# Patient Record
Sex: Male | Born: 1970 | Race: White | Hispanic: No | Marital: Married | Smoking: Current every day smoker
Health system: Southern US, Community
[De-identification: ages and names within clinical notes are randomized; demographics above are authoritative.]

## PROBLEM LIST (undated history)

## (undated) DIAGNOSIS — N200 Calculus of kidney: Secondary | ICD-10-CM

## (undated) HISTORY — PX: HERNIA REPAIR: SHX51

## (undated) HISTORY — DX: Calculus of kidney: N20.0

---

## 2005-11-11 ENCOUNTER — Emergency Department (HOSPITAL_COMMUNITY): Admission: EM | Admit: 2005-11-11 | Discharge: 2005-11-11 | Payer: Self-pay | Admitting: Emergency Medicine

## 2007-11-16 ENCOUNTER — Emergency Department (HOSPITAL_COMMUNITY): Admission: EM | Admit: 2007-11-16 | Discharge: 2007-11-16 | Payer: Self-pay | Admitting: Emergency Medicine

## 2011-01-06 LAB — POCT I-STAT, CHEM 8
Calcium, Ion: 1.16
Chloride: 109
Glucose, Bld: 96
HCT: 41
TCO2: 25

## 2014-07-16 ENCOUNTER — Inpatient Hospital Stay
Admit: 2014-07-16 | Disposition: A | Discharge status: Home / Self Care | Payer: Self-pay | Attending: Internal Medicine | Admitting: Internal Medicine

## 2014-07-16 HISTORY — PX: CYSTOSCOPY W/ URETERAL STENT PLACEMENT: SHX1429

## 2014-07-16 LAB — COMPREHENSIVE METABOLIC PANEL
ALT: 22 U/L
Albumin: 4.6 g/dL
Alkaline Phosphatase: 42 U/L
Anion Gap: 7 (ref 7–16)
BUN: 22 mg/dL — ABNORMAL HIGH
Bilirubin,Total: 0.3 mg/dL
CALCIUM: 9.2 mg/dL
CO2: 26 mmol/L
CREATININE: 1.28 mg/dL — AB
Chloride: 103 mmol/L
EGFR (African American): >60
EGFR (Non-African Amer.): >60
Glucose: 104 mg/dL — ABNORMAL HIGH
POTASSIUM: 3.9 mmol/L
SGOT(AST): 23 U/L
Sodium: 136 mmol/L
Total Protein: 7.3 g/dL

## 2014-07-16 LAB — URINALYSIS, COMPLETE
BILIRUBIN, UR: NEGATIVE
GLUCOSE, UR: NEGATIVE mg/dL (ref 0–75)
Ketone: NEGATIVE
Leukocyte Esterase: NEGATIVE
NITRITE: NEGATIVE
PH: 5 (ref 4.5–8.0)
Protein: NEGATIVE
Specific Gravity: 1.015 (ref 1.003–1.030)

## 2014-07-16 LAB — CBC
HCT: 40.4 % (ref 40.0–52.0)
HGB: 13.7 g/dL (ref 13.0–18.0)
MCH: 31.4 pg (ref 26.0–34.0)
MCHC: 34 g/dL (ref 32.0–36.0)
MCV: 92 fL (ref 80–100)
PLATELETS: 199 10*3/uL (ref 150–440)
RBC: 4.37 10*6/uL — ABNORMAL LOW (ref 4.40–5.90)
RDW: 13.1 % (ref 11.5–14.5)
WBC: 10.6 10*3/uL (ref 3.8–10.6)

## 2014-07-17 LAB — BASIC METABOLIC PANEL
Anion Gap: 2 — ABNORMAL LOW (ref 7–16)
BUN: 16 mg/dL
Calcium, Total: 8.1 mg/dL — ABNORMAL LOW
Chloride: 107 mmol/L
Co2: 29 mmol/L
Creatinine: 1 mg/dL
EGFR (African American): >60
EGFR (Non-African Amer.): >60
GLUCOSE: 95 mg/dL
Potassium: 4 mmol/L
SODIUM: 138 mmol/L

## 2014-07-17 LAB — CBC WITH DIFFERENTIAL/PLATELET
Basophil #: 0 10*3/uL (ref 0.0–0.1)
Basophil %: 0.2 %
EOS ABS: 0.1 10*3/uL (ref 0.0–0.7)
Eosinophil %: 0.9 %
HCT: 37.6 % — ABNORMAL LOW (ref 40.0–52.0)
HGB: 12.5 g/dL — ABNORMAL LOW (ref 13.0–18.0)
LYMPHS ABS: 1.6 10*3/uL (ref 1.0–3.6)
Lymphocyte %: 20.9 %
MCH: 31.1 pg (ref 26.0–34.0)
MCHC: 33.1 g/dL (ref 32.0–36.0)
MCV: 94 fL (ref 80–100)
MONO ABS: 1 x10 3/mm (ref 0.2–1.0)
Monocyte %: 12.3 %
NEUTROS ABS: 5.1 10*3/uL (ref 1.4–6.5)
Neutrophil %: 65.7 %
PLATELETS: 163 10*3/uL (ref 150–440)
RBC: 4.01 10*6/uL — ABNORMAL LOW (ref 4.40–5.90)
RDW: 13.2 % (ref 11.5–14.5)
WBC: 7.8 10*3/uL (ref 3.8–10.6)

## 2014-07-18 LAB — URINE CULTURE

## 2014-08-04 ENCOUNTER — Ambulatory Visit: Payer: Self-pay

## 2014-08-04 NOTE — Anesthesia Preprocedure Evaluation (Addendum)
Anesthesia Evaluation  Patient identified by MRN, date of birth, ID band Patient awake    Reviewed: Allergy & Precautions, NPO status , Patient's Chart, lab work & pertinent test results  Airway Mallampati: I  TM Distance: >3 FB Neck ROM: Full    Dental  (+) Teeth Intact   Pulmonary Current Smoker,  breath sounds clear to auscultation        Cardiovascular Exercise Tolerance: Good Rhythm:Regular Rate:Normal     Neuro/Psych    GI/Hepatic Neg liver ROS, GERD-  Medicated,NPO 9:30 last night, no reflux today, took prilosec.   Endo/Other    Renal/GU Renal InsufficiencyRenal diseaseSlightly elevated creatinine since stone.   Kidney stone right, and stent.    Musculoskeletal   Abdominal   Peds  Hematology   Anesthesia Other Findings   Reproductive/Obstetrics                          Anesthesia Physical Anesthesia Plan  ASA: II  Anesthesia Plan: General   Post-op Pain Management:    Induction: Intravenous  Airway Management Planned: Oral ETT and LMA  Additional Equipment:   Intra-op Plan:   Post-operative Plan: Extubation in OR  Informed Consent: I have reviewed the patients History and Physical, chart, labs and discussed the procedure including the risks, benefits and alternatives for the proposed anesthesia with the patient or authorized representative who has indicated his/her understanding and acceptance.     Plan Discussed with: CRNA  Anesthesia Plan Comments:        Anesthesia Quick Evaluation

## 2014-08-06 ENCOUNTER — Ambulatory Visit
Admit: 2014-08-06 | Disposition: A | Discharge status: Home / Self Care | Payer: Self-pay | Attending: Urology | Admitting: Urology

## 2014-08-06 LAB — URINALYSIS, COMPLETE
Bilirubin,UR: NEGATIVE
GLUCOSE, UR: NEGATIVE mg/dL (ref 0–75)
KETONE: NEGATIVE
Nitrite: NEGATIVE
Ph: 7 (ref 4.5–8.0)
Protein: 100
Specific Gravity: 1.014 (ref 1.003–1.030)
Squamous Epithelial: NONE SEEN

## 2014-08-08 LAB — URINE CULTURE

## 2014-08-09 NOTE — Consult Note (Signed)
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 No prior stones. No additional stones. NO fevers or leukocytosis. Pain refractory to IV narcotics.  PMH sig for left inguinal hernia repair, GERD. No CV disease. No blood thinners.  Today "Catalina Antigua" is seen as urgent consult for baove. He is referred by Bettey Costa MD. Last meal >12 hours ago.    No Known Allergies:   Case History and Physical Exam:  Chief Complaint flank pain   Past Surgical History Inguinal Hernia Repair   Family History Non-Contributory   HEENT PERLA   Neck/Nodes Supple   Chest/Lungs Clear   Cardiovascular Normal Sinus Rhythm   Abdomen Benign   Genitalia WNL  Mild Rt CVAT   Musculoskeletal Full range of motion   Neurological Grossly WNL   Skin Warm   Nursing/Ancillary Notes: **Vital Signs.:   07-Apr-16 14:45  Vital Signs Type Admission  Temperature Temperature (F) 98.7  Celsius 37  Temperature Source oral  Pulse Pulse 76  Systolic BP Systolic BP 161  Diastolic BP (mmHg) Diastolic BP (mmHg) 89   Routine Chem:  07-Apr-16 09:35   Creatinine (comp)  1.28 (0.61-1.24 NOTE: New Reference Range  06/16/14)  Routine UA:  07-Apr-16 09:35   Color (UA) YELLOW  Clarity (UA) CLEAR  Glucose (UA) NEGATIVE  Bilirubin (UA) NEGATIVE  Ketones (UA) NEGATIVE  Specific Gravity (UA) 1.015  Blood (UA) 3+  pH (UA) 5.0  Protein (UA) NEGATIVE  Nitrite (UA) NEGATIVE  Leukocyte Esterase (UA) NEGATIVE (Result(s) reported on 16 Jul 2014 at 10:43AM.)  RBC (UA) >30  WBC (UA) 0-5  Bacteria (UA) 1+ (TRACE/FEW)  Epithelial Cells (UA) 0-5 / HPF  Mucous (UA) PRESENT (Result(s) reported on 16 Jul 2014 at 10:43AM.)    Impression 1 - Right Distal Ureteral Stone, Refractory Pain   Plan 1 -  Right Distal Ureteral Stone, Refractory Pain - Discussed options of medical therapy v. urgent renal decompression with right ureteral stent followed by SWL or ureteroscopy in elective setting. Immediate goals pain control and renal decompression. Risks, benefits, alternatives discussed in detail. Pt voiced understanding and wants to proceed today.   Electronic Signatures: Georgie Chard (MD)  (Signed 07-Apr-16 17:01)  Authored: Health Issues, General Aspect/Present Illness, Allergies, History and Physical Exam, Vital Signs, Labs, Impression/Plan   Last Updated: 07-Apr-16 17:01 by Georgie Chard (MD)

## 2014-08-09 NOTE — Consult Note (Signed)
Chief Complaint:  Subjective/Chief Complaint pain improved this AM, residual stent pain with voiding.  no n/v.   VITAL SIGNS/ANCILLARY NOTES: **Vital Signs.:   08-Apr-16 07:55  Vital Signs Type Q 8hr  Temperature Temperature (F) 98.1  Celsius 36.7  Temperature Source oral  Pulse Pulse 74  Respirations Respirations 18  Systolic BP Systolic BP 98  Diastolic BP (mmHg) Diastolic BP (mmHg) 58  Mean BP 71  Pulse Ox % Pulse Ox % 95  Pulse Ox Activity Level  At rest  Oxygen Delivery Room Air/ 21 %  *Intake and Output.:   Daily 08-Apr-16 07:00  Grand Totals Intake:  1444 Output:  1325    Net:  119 24 Hr.:  119  Oral Intake      In:  360  IV (Primary)      In:  1084  Urine ml     Out:  1325  Length of Stay Totals Intake:  1444 Output:  1325    Net:  119   Brief Assessment:  GEN well developed, well nourished, no acute distress   Cardiac Regular   Respiratory normal resp effort  no use of accessory muscles   Gastrointestinal Normal   Gastrointestinal details normal Soft  Nontender  Nondistended  No masses palpable   EXTR negative cyanosis/clubbing   Lab Results: Routine Chem:  08-Apr-16 06:06   Glucose, Serum 95 (65-99 NOTE: New Reference Range  06/16/14)  BUN 16 (6-20 NOTE: New Reference Range  06/16/14)  Creatinine (comp) 1.00 (0.61-1.24 NOTE: New Reference Range  06/16/14)  Sodium, Serum 138 (135-145 NOTE: New Reference Range  06/16/14)  Potassium, Serum 4.0 (3.5-5.1 NOTE: New Reference Range  06/16/14)  Chloride, Serum 107 (101-111 NOTE: New Reference Range  06/16/14)  CO2, Serum 29 (22-32 NOTE: New Reference Range  06/16/14)  Calcium (Total), Serum  8.1 (8.9-10.3 NOTE: New Reference Range  06/16/14)  Anion Gap  2  eGFR (African American) >60  eGFR (Non-African American) >60 (eGFR values <47m/min/1.73 m2 may be an indication of chronic kidney disease (CKD). Calculated eGFR is useful in patients with stable renal function. The eGFR calculation  will not be reliable in acutely ill patients when serum creatinine is changing rapidly. It is not useful in patients on dialysis. The eGFR calculation may not be applicable to patients at the low and high extremes of body sizes, pregnant women, and vegetarians.)  Routine Hem:  08-Apr-16 06:06   WBC (CBC) 7.8  RBC (CBC)  4.01  Hemoglobin (CBC)  12.5  Hematocrit (CBC)  37.6  Platelet Count (CBC) 163  MCV 94  MCH 31.1  MCHC 33.1  RDW 13.2  Neutrophil % 65.7  Lymphocyte % 20.9  Monocyte % 12.3  Eosinophil % 0.9  Basophil % 0.2  Neutrophil # 5.1  Lymphocyte # 1.6  Monocyte # 1.0  Eosinophil # 0.1  Basophil # 0.0 (Result(s) reported on 17 Jul 2014 at 06:43AM.)   Assessment/Plan:  Assessment/Plan:  Assessment 44yo M POD 1 s/p right ureteral stent placement for 612mRt UVJ stone.  Doing well.  Cr/ pain improved.   Plan 1) OK to discharge today 2) Please discharge with pain meds, ditropan 5 mg tid prn bladder spasms and Flomax 0.4 mg qhs 3) Patient should STRAIN urine in case he passes stone spontaneneously 4) f/u BuAustinext week to arrange for definitive management of his stone (prefers URS)  Please page with any questions or concerns.   Electronic Signatures: BrSherlynn StallsMD)  (Signed  08-Apr-16 08:00)  Authored: Chief Complaint, VITAL SIGNS/ANCILLARY NOTES, Brief Assessment, Lab Results, Assessment/Plan   Last Updated: 08-Apr-16 08:00 by Sherlynn Stalls (MD)

## 2014-08-09 NOTE — Op Note (Signed)
PATIENT NAME:  Harold Sosa, Harold Sosa MR#:  [id number] DATE OF BIRTH:  1971/01/13  DATE OF PROCEDURE:  07/16/2014  PREOPERATIVE DIAGNOSES:   1.  Right ureteral stone. 2.  Hydronephrosis. 3.  Refractory flank pain.    PROCEDURE:   1.  Cystoscopy right retrograde pyelogram (Dictation Anomaly)<<MISSING TEXT>> lesion. 2.  Right ureteral stent placement, 6 x 26 (Dictation Anomaly)<<MISSING TEXT>>.  ESTIMATED BLOOD LOSS:  Nil.  (Dictation Anomaly)<<MISSING TEXT>> None.  SPECIMENS:  None.  FINDINGS: 1.  Unremarkable urinary bladder. 2.  Moderate hydroureter nephrosis to the level distal ureter.  3.  Successful placement of right ureteral stent, proximal and renal pelvis, distal and urinary bladder.   INDICATIONS:  [name] is a very pleasant 44 year old gentleman without significant medical history. He presented to the [facility] Emergency Room early today with acute onset of right flank pain.  CT imaging revealed a distal right ureteral stone.  He had no obvious infectious parameters.  He was admitted as his pain control was somewhat difficult.  His pain remained refractory despite both rounds of IV narcotics.  Consultation was sought.  We evaluated the patient and discussed options for renal decompression including a nephrostomy tube versus stenting versus non-decompression and he wished to proceed with right ureteral stenting.  Informed consent was obtained and placed in the medical records.     The patient is being (Dictation Anomaly)<<MISSING TEXT>> right ureteral stent placement confirmed.  A (Dictation Anomaly)<<MISSING TEXT>> was performed.  Antibiotics were administered, general LMA anesthesia was introduced.  The patient was placed into a low lithotomy position and a sterile field was created by prepping and draping the patient's penis.  Peritoneum (Dictation Anomaly)<<MISSING TEXT>> using iodine x 3.  Next, cystourethroscopy was performed using a 26 [place] (Dictation Anomaly)<<MISSING TEXT>>   with offset lens, especially anterior and posterior urethra under (Dictation Anomaly)<<MISSING TEXT>> revealed no diverticula, calcifications, or (Dictation Anomaly)<<MISSING TEXT>>  lesions.  The right (Dictation Anomaly)<<MISSING TEXT>>  orifice was (Dictation Anomaly)<<MISSING TEXT>> catheter and right retrograde pyelogram was seen.   Right retrograde pyelogram (Dictation Anomaly)<<MISSING TEXT>> single a right ureter, single (Dictation Anomaly)<<MISSING TEXT>> kidney.  There was moderate hydroureter nephrosis at the level of the distal ureter at 0.038.  A central wire was advanced along the (Dictation Anomaly)<<MISSING TEXT>>  pole of which a new 6 x 26 (Dictation Anomaly)<<MISSING TEXT>> type stent was carefully placed to the proximal end.  Good proximal deployment was noted in the upper pole, distal and urinary bladder.  There was efflux of copious (Dictation Anomaly)<<MISSING TEXT>> appearing urine around and through the distal end of the stent.  Bladder (Dictation Anomaly)<<MISSING TEXT>> terminated.  The patient tolerated the procedure well (Dictation Anomaly)<<MISSING TEXT>>.   The patient was taken to the postanesthesia care unit in stable condition.           ____________________________ Lillia Pauls Tresa Moore, MD trm:DT D: 07/16/2014 18:12:03 ET T: 07/16/2014 20:09:20 ET JOB#: [id number]  cc: Lillia Pauls. Tresa Moore, MD, <Dictator>

## 2014-08-09 NOTE — Discharge Summary (Signed)
PATIENT NAME:  Harold Sosa, Harold Sosa MR#:  [id number] DATE OF BIRTH:  Oct 08, 1970  DATE OF ADMISSION:  07/16/2014 DATE OF DISCHARGE:  07/17/2014  DISCHARGE DIAGNOSES:  Right urethral stone status post stent.   DISCHARGE MEDICATIONS: 1.  Nexium over-the-counter 20 mg daily.  2.  Percocet 5/325 one tablet every 6 hours as needed for pain.  3.  Flomax 0.4 mg oral once a day.  4.  Oxybutynin 5 mg oral 3 times a day as needed for bladder spasms.   DISCHARGE INSTRUCTIONS: Regular diet of regular consistency. Activity as tolerated. Follow up with Dr. Erlene Quan of urology in 1 to 2 weeks. The patient was also counseled to quit smoking during the hospital stay.   CONSULTS:  Sherlynn Stalls, MD, with urology.   PROCEDURES: Cystoscopy and ureteral stent placement on the right side.   IMAGING STUDIES: Include CT scan of the abdomen and pelvis without contrast which showed mild right-sided hydronephrosis, 6 mm calcified obstructive calculus at the right UVJ urinary bladder.   ADMITTING HISTORY AND PHYSICAL AND HOSPITAL COURSE: Please see detailed H and P dictated by Dr. Benjie Karvonen. In brief, a 44 year old male patient presented to the hospital complaining of right-sided flank, groin pain. He had a CT of the abdomen and pelvis done which shows a 6 mm obstructive stone with mild right-sided hydronephrosis, was admitted to the hospitalist service. Urology was consulted, was taken to the OR; had a right ureteral stent placement, with which she has improved well but stone is still present, but he will follow up with Dr. Erlene Quan in his office for further care in 1 to 2 weeks. The patient did not have any infection. Pain was well controlled by day of discharge and is being discharged in a stable condition.   Prior to discharge, the patient's abdomen had some mild tenderness in the left flank area. S1, S2 heard. Lungs sound clear.   Time spent on day of discharge in discharge activity was 40 minutes.     ____________________________ [name], MD srs:nt D: 07/20/2014 13:23:47 ET T: 07/20/2014 18:13:12 ET JOB#: [id number]  cc: Alveta Heimlich R. Darvin Neighbours, MD, <Dictator> Sherlynn Stalls, MD Neita Carp MD ELECTRONICALLY SIGNED 08/03/2014 10:49

## 2014-08-09 NOTE — H&P (Signed)
PATIENT NAME:  Harold Sosa, Harold Sosa MR#:  [id number] DATE OF BIRTH:  1970/08/13  DATE OF ADMISSION:  07/16/2014  PRIMARY CARE PHYSICIAN: Meindert A. Brunetta Genera, MD   CHIEF COMPLAINT: Flank pain.   HISTORY OF PRESENT ILLNESS: This is a 44 year old male with no past medical history, who presents with the above complaint. Since midnight, the patient has had intense 10/10 right-sided flank pain and noted hematuria this morning. CT scan in the Emergency Department showed mild right hydronephrosis and hydroureter, along with a 6 mm calcified obstructive calculus in the right UVJ/urinary bladder wall. The patient continues to have pain, despite receiving Dilaudid.   PAST MEDICAL HISTORY: BPH.  MEDICATIONS: Flomax 0.4 mg daily and Nexium OTC 20 mg daily p.r.n.   ALLERGIES: No known drug allergies.   SOCIAL HISTORY: The patient smokes 1/2 pack a day. Occasional alcohol. No IV drug use.   PAST SURGICAL HISTORY: Hernia surgery.   FAMILY HISTORY: Positive for asthma and prostate cancer in remission.   REVIEW OF SYSTEMS:  CONSTITUTIONAL: No fever. Positive chills. Positive weakness.  EYES: Has no blurred or double vision, glaucoma, cataracts.  EARS, NOSE, AND THROAT: No ear pain or hearing loss, seasonal allergies.  RESPIRATORY: No cough, wheezing, hemoptysis, COPD. CARDIOVASCULAR: No chest pain, orthopnea, edema, arrhythmia, or dyspnea on exertion. blood cells and glucose emesis of deep vein thrombosis in the evening and was seen on days.  GASTROINTESTINAL: Positive nausea. No vomiting, diarrhea. Positive right-sided flank pain. No melena or ulcers.  GENITOURINARY: Positive hematuria.  ENDOCRINE: No polyphagia polydipsia HEMATOLOGICAL AND LYMPHATIC: No anemia or easy bruising.  SKIN: No rashes or lesions. MUSCULOSKELETAL: No pain in shoulders or knees.  NEUROLOGICAL: No history of CVA, TIA, or seizures.  PSYCHIATRIC: No history of anxiety or depression.   PHYSICAL EXAMINATION: VITAL SIGNS: The  patient is afebrile, temperature 98.9, pulse 79, respirations 18, blood pressure 127/86, at  96% on room air.  GENERAL: The patient is alert, oriented, in moderate distress due to pain.  HEENT: Head is atraumatic. Pupils are round and reactive. Sclerae anicteric. Mucous membranes are moist. Oropharynx is clear.  NECK: Supple without JVD, carotid bruit, or enlarged thyroid.  CARDIOVASCULAR: Regular rate and rhythm. No murmurs or gallops. PMI is nondisplaced.. LUNGS: Clear to auscultation without crackles, rales, rhonchi or wheezing noted to percussion.  ABDOMEN: Bowel sounds are positive. Nontender, nondistended. No hepatosplenomegaly.  BACK: He has right-sided CVA tenderness.  EXTREMITIES: No clubbing, cyanosis, or edema.  NEUROLOGICAL: Cranial nerves II through XII are intact. There are no focal deficits.  SKIN: Without rash or lesions.    LABORATORY DATA: CT of the abdomen and pelvis shows a mild right hydronephrosis and hydroureter with a 6 mm calcified obstructive calculus in the right UPJ/urinary bladder wall. There is an indeterminant low density lesion in the right hepatic lobe, measures at least 4 cm, further evaluation with nonemergent MRI.   Urinalysis shows greater than 30 red blood cells 1+ bacteria, 0 to 5 white blood cells, negative LCE and nitrites. White blood cells 10.6, hemoglobin 13.7, hematocrit 41, platelets are 199,000. Sodium 136, potassium 3.9, chloride 103, bicarbonate 26, BUN 22, creatinine 1.28, glucose is 104, bilirubin 0.3, calcium 9.2, alkaline phosphatase 42, ALT 22, AST 23, total protein 7.3, albumin 4.6.   ASSESSMENT AND PLAN: This is a 44 year old male who presents with right-sided flank pain, found to have a 6 mm obstructive kidney stone with mild hydronephrosis.  1. Right nephrolithiasis with obstructive uropathy. The patient will be admitted to the hospitalist  service with pain control. Urology has been consulted. Continue antiemetics p.r.n. We will also add  ciprofloxacin. A urine culture has been ordered.  2. Hematuria due to the kidney stone.  3. Tobacco dependence. The patient is encouraged to stop smoking. The patient was counseled for 3-1/2 minutes. He does not want a nicotine patch.  4. Benign prostatic hypertrophy. We will continue Flomax.   CODE STATUS: The patient is a FULL CODE status.    TIME SPENT: Approximately 45 minutes.     ____________________________ Donell Beers. Benjie Karvonen, MD spm:mw D: 07/16/2014 14:16:52 ET T: 07/16/2014 14:53:34 ET JOB#: [id number]  cc: Alannie Amodio P. Benjie Karvonen, MD, <Dictator> [facility] Cordale Manera P Cobey Raineri MD ELECTRONICALLY SIGNED 07/16/2014 17:27

## 2014-08-12 ENCOUNTER — Ambulatory Visit: Payer: 59 | Admitting: Anesthesiology

## 2014-08-12 ENCOUNTER — Encounter
Admission: RE | Disposition: A | Discharge status: Home / Self Care | Payer: Self-pay | Source: Ambulatory Visit | Attending: Urology

## 2014-08-12 ENCOUNTER — Ambulatory Visit
Admission: RE | Admit: 2014-08-12 | Discharge: 2014-08-12 | Disposition: A | Discharge status: Home / Self Care | Payer: 59 | Source: Ambulatory Visit | Attending: Urology | Admitting: Urology

## 2014-08-12 ENCOUNTER — Ambulatory Visit: Payer: 59 | Admitting: *Deleted

## 2014-08-12 DIAGNOSIS — Z9889 Other specified postprocedural states: Secondary | ICD-10-CM | POA: Diagnosis not present

## 2014-08-12 DIAGNOSIS — N200 Calculus of kidney: Secondary | ICD-10-CM

## 2014-08-12 DIAGNOSIS — N201 Calculus of ureter: Secondary | ICD-10-CM | POA: Insufficient documentation

## 2014-08-12 DIAGNOSIS — F17200 Nicotine dependence, unspecified, uncomplicated: Secondary | ICD-10-CM | POA: Diagnosis not present

## 2014-08-12 DIAGNOSIS — K219 Gastro-esophageal reflux disease without esophagitis: Secondary | ICD-10-CM | POA: Diagnosis not present

## 2014-08-12 DIAGNOSIS — Z79899 Other long term (current) drug therapy: Secondary | ICD-10-CM | POA: Insufficient documentation

## 2014-08-12 DIAGNOSIS — Z79891 Long term (current) use of opiate analgesic: Secondary | ICD-10-CM | POA: Diagnosis not present

## 2014-08-12 HISTORY — PX: URETEROSCOPY WITH HOLMIUM LASER LITHOTRIPSY: SHX6645

## 2014-08-12 HISTORY — PX: CYSTOSCOPY W/ URETERAL STENT PLACEMENT: SHX1429

## 2014-08-12 SURGERY — URETEROSCOPY, WITH LITHOTRIPSY USING HOLMIUM LASER
Anesthesia: General | Laterality: Right | Wound class: Clean Contaminated

## 2014-08-12 MED ORDER — ACETAMINOPHEN 10 MG/ML IV SOLN
INTRAVENOUS | Status: AC
  Administered 2014-08-12: 1000 mg via INTRAVENOUS
  Filled 2014-08-12: qty 100

## 2014-08-12 MED ORDER — LEVOFLOXACIN IN D5W 500 MG/100ML IV SOLN
500.0000 mg | Freq: Once | INTRAVENOUS | Status: AC
  Administered 2014-08-12: 500 mg via INTRAVENOUS

## 2014-08-12 MED ORDER — LIDOCAINE HCL (CARDIAC) 20 MG/ML IV SOLN
INTRAVENOUS | Status: DC | PRN
  Administered 2014-08-12: 100 mg via INTRAVENOUS

## 2014-08-12 MED ORDER — KETAMINE HCL 50 MG/ML IJ SOLN
INTRAMUSCULAR | Status: DC | PRN
  Administered 2014-08-12: 25 mg via INTRAMUSCULAR

## 2014-08-12 MED ORDER — PROPOFOL 10 MG/ML IV BOLUS
INTRAVENOUS | Status: DC | PRN
  Administered 2014-08-12: 150 mg via INTRAVENOUS

## 2014-08-12 MED ORDER — HYDROMORPHONE HCL 1 MG/ML IJ SOLN: 0.2500 mg | INTRAMUSCULAR | Status: DC | PRN

## 2014-08-12 MED ORDER — MIDAZOLAM HCL 2 MG/2ML IJ SOLN
INTRAMUSCULAR | Status: DC | PRN
  Administered 2014-08-12: 2 mg via INTRAVENOUS

## 2014-08-12 MED ORDER — PHENYLEPHRINE HCL 10 MG/ML IJ SOLN
INTRAMUSCULAR | Status: DC | PRN
  Administered 2014-08-12 (×2): 100 ug via INTRAVENOUS

## 2014-08-12 MED ORDER — DOCUSATE SODIUM 100 MG PO CAPS: 100.0000 mg | ORAL_CAPSULE | Freq: Two times a day (BID) | ORAL | Status: AC | PRN

## 2014-08-12 MED ORDER — LACTATED RINGERS IV SOLN
INTRAVENOUS | Status: DC
  Administered 2014-08-12 (×2): via INTRAVENOUS

## 2014-08-12 MED ORDER — ONDANSETRON HCL 4 MG/2ML IJ SOLN
INTRAMUSCULAR | Status: DC | PRN
  Administered 2014-08-12: 4 mg via INTRAVENOUS

## 2014-08-12 MED ORDER — METOCLOPRAMIDE HCL 10 MG PO TABS
ORAL_TABLET | ORAL | Status: AC
  Filled 2014-08-12: qty 1

## 2014-08-12 MED ORDER — OXYCODONE HCL 5 MG PO TABS: 5.0000 mg | ORAL_TABLET | ORAL | Status: AC | PRN

## 2014-08-12 MED ORDER — HYDROCODONE-ACETAMINOPHEN 5-325 MG PO TABS
ORAL_TABLET | ORAL | Status: AC
  Filled 2014-08-12: qty 1

## 2014-08-12 MED ORDER — LIDOCAINE HCL (PF) 1 % IJ SOLN
INTRAMUSCULAR | Status: AC
  Administered 2014-08-12: 09:00:00
  Filled 2014-08-12: qty 2

## 2014-08-12 MED ORDER — FENTANYL CITRATE (PF) 100 MCG/2ML IJ SOLN
INTRAMUSCULAR | Status: DC | PRN
  Administered 2014-08-12: 100 ug via INTRAVENOUS

## 2014-08-12 MED ORDER — SODIUM CHLORIDE 0.9 % IR SOLN
Status: DC | PRN
  Administered 2014-08-12: 300 mL

## 2014-08-12 MED ORDER — HYDROCODONE-ACETAMINOPHEN 7.5-325 MG PO TABS
1.0000 | ORAL_TABLET | Freq: Once | ORAL | Status: AC | PRN
  Administered 2014-08-12: 1 via ORAL

## 2014-08-12 MED ORDER — LEVOFLOXACIN IN D5W 500 MG/100ML IV SOLN
INTRAVENOUS | Status: AC
  Filled 2014-08-12: qty 100

## 2014-08-12 MED ORDER — ONDANSETRON HCL 4 MG/2ML IJ SOLN: 4.0000 mg | Freq: Once | INTRAMUSCULAR | Status: DC | PRN

## 2014-08-12 SURGICAL SUPPLY — 35 items
ADAPTER SCOPE UROLOK II (MISCELLANEOUS) ×3 IMPLANT
ADHESIVE MASTISOL STRL (MISCELLANEOUS) ×3 IMPLANT
BAG DRAIN CYSTO-URO LG1000N (MISCELLANEOUS) ×3 IMPLANT
BASKET NITINOL 4 WIRE 16 (BASKET) IMPLANT
BASKET NITINOL 4 WIRE 16MM (BASKET)
BASKET ZERO TIP 1.9FR (BASKET) ×3 IMPLANT
BULB IRRIG PATHFIND (MISCELLANEOUS) ×3 IMPLANT
CATH URETL 5X70 OPEN END (CATHETERS) ×3 IMPLANT
CNTNR SPEC 2.5X3XGRAD LEK (MISCELLANEOUS) ×1
CONRAY 43 FOR UROLOGY 50M (MISCELLANEOUS) IMPLANT
CONT SPEC 4OZ STER OR WHT (MISCELLANEOUS) ×2
CONTAINER SPEC 2.5X3XGRAD LEK (MISCELLANEOUS) ×1 IMPLANT
DRSG TEGADERM 2-3/8X2-3/4 SM (GAUZE/BANDAGES/DRESSINGS) ×3 IMPLANT
GLOVE BIO SURGEON STRL SZ 6.5 (GLOVE) ×2 IMPLANT
GLOVE BIO SURGEON STRL SZ7 (GLOVE) ×6 IMPLANT
GLOVE BIO SURGEONS STRL SZ 6.5 (GLOVE) ×1
GOWN STRL REUS W/ TWL LRG LVL3 (GOWN DISPOSABLE) ×2 IMPLANT
GOWN STRL REUS W/TWL LRG LVL3 (GOWN DISPOSABLE) ×4
INTRODUCER DILATOR DOUBLE (INTRODUCER) IMPLANT
JELLY LUB 2OZ STRL (MISCELLANEOUS) ×2
JELLY LUBE 2OZ STRL (MISCELLANEOUS) ×1 IMPLANT
PACK CYSTO AR (MISCELLANEOUS) ×3 IMPLANT
PREP PVP WINGED SPONGE (MISCELLANEOUS) ×3 IMPLANT
PUMP SINGLE ACTION SAP (PUMP) IMPLANT
SENSORWIRE 0.038 NOT ANGLED (WIRE) ×6
SET CYSTO W/LG BORE CLAMP LF (SET/KITS/TRAYS/PACK) ×3 IMPLANT
SHEATH URETERAL 12FRX35CM (MISCELLANEOUS) ×3 IMPLANT
SOL .9 NS 3000ML IRR  AL (IV SOLUTION) ×2
SOL .9 NS 3000ML IRR UROMATIC (IV SOLUTION) ×1 IMPLANT
STENT URET 6FRX24 CONTOUR (STENTS) IMPLANT
STENT URET 6FRX26 CONTOUR (STENTS) ×3 IMPLANT
SYRINGE IRR TOOMEY STRL 70CC (SYRINGE) ×3 IMPLANT
TUBING PRES M/F 48IN 4237111 (TUBING) ×3 IMPLANT
WATER STERILE IRR 1000ML POUR (IV SOLUTION) ×3 IMPLANT
WIRE SENSOR 0.038 NOT ANGLED (WIRE) ×2 IMPLANT

## 2014-08-12 NOTE — Brief Op Note (Signed)
08/12/2014  11:24 AM  PATIENT:  Harold Sosa  44 y.o. male  PRE-OPERATIVE DIAGNOSIS:  RIGHT uerteral STONE  POST-OPERATIVE DIAGNOSIS:  Right ureteral stone   PROCEDURE:  Procedure(s): RIGHT URETEROSCOPY WITH BASKET EXTRACTION OF STONE  (Right) CYSTOSCOPY WITH STENT EXCHANGE  (Right)  SURGEON:  Surgeon(s) and Role:    * Hollice Espy, MD - Primary  ASSISTANTS: none   ANESTHESIA:   general  EBL:  Total I/O In: 900 [I.V.:900] Out: 1 [Blood:1]  BLOOD ADMINISTERED:none  DRAINS: 6 x 26 Fr JJ ureteral stent on right (string in place)   LOCAL MEDICATIONS USED:  NONE  SPECIMEN:  Source of Specimen:  stone fragement  DISPOSITION OF SPECIMEN:  N/A  COUNTS:  YES  TOURNIQUET:  * No tourniquets in log *  PLAN OF CARE: Discharge to home after PACU  PATIENT DISPOSITION:  PACU - hemodynamically stable.   Delay start of Pharmacological VTE agent (>24hrs) due to surgical blood loss or risk of bleeding: not applicable

## 2014-08-12 NOTE — H&P (Signed)
See patient H&P

## 2014-08-12 NOTE — Discharge Instructions (Addendum)
You have a ureteral stent in place.  This is a tube that extends from your kidney to your bladder.  This may cause urinary bleeding, burning with urination, and urinary frequency.  Please call our office or present to the ED if you develop fevers >101 or pain which is not able to be controlled with oral pain medications.  You may be given either Flomax and/ or ditropan to help with bladder spasms and stent pain in addition to pain medications.    Your stent is attached to a string which is taped to your penis.  Please remove the stent as early as Friday.  Call if you have issues.  [facility] [address] [place], [place] [address] [phone] General Anesthesia, Care After Refer to this sheet in the next few weeks. These instructions provide you with information on caring for yourself after your procedure. Your health care provider may also give you more specific instructions. Your treatment has been planned according to current medical practices, but problems sometimes occur. Call your health care provider if you have any problems or questions after your procedure. WHAT TO EXPECT AFTER THE PROCEDURE After the procedure, it is typical to experience:  Sleepiness.  Nausea and vomiting. HOME CARE INSTRUCTIONS  For the first 24 hours after general anesthesia:  Have a responsible person with you.  Do not drive a car. If you are alone, do not take public transportation.  Do not drink alcohol.  Do not take medicine that has not been prescribed by your health care provider.  Do not sign important papers or make important decisions.  You may resume a normal diet and activities as directed by your health care provider.  Change bandages (dressings) as directed.  If you have questions or problems that seem related to general anesthesia, call the hospital and ask for the anesthetist or anesthesiologist on call. SEEK MEDICAL CARE IF:  You have nausea and  vomiting that continue the day after anesthesia.  You develop a rash. SEEK IMMEDIATE MEDICAL CARE IF:   You have difficulty breathing.  You have chest pain.  You have any allergic problems. Document Released: 07/03/2000 Document Revised: 04/01/2013 Document Reviewed: 10/10/2012 [facility] Patient Information 2015 [place], [place]. This information is not intended to replace advice given to you by your health care provider. Make sure you discuss any questions you have with your health care provider.

## 2014-08-12 NOTE — Anesthesia Postprocedure Evaluation (Signed)
  Anesthesia Post-op Note  Patient: Harold Sosa  Procedure(s) Performed: Procedure(s): RIGHT URETEROSCOPY WITH BASKET EXTRACTION OF STONE  (Right) CYSTOSCOPY WITH STENT EXCHANGE  (Right)  Anesthesia type:General  Patient location: PACU  Post pain: Pain level controlled  Post assessment: Post-op Vital signs reviewed, Patient's Cardiovascular Status Stable, Respiratory Function Stable, Patent Airway and No signs of Nausea or vomiting  Post vital signs: Reviewed and stable  Last Vitals:  Filed Vitals:   08/12/14 1148  BP:   Pulse: 70  Temp:   Resp: 15    Level of consciousness: awake, alert  and patient cooperative  Complications: No apparent anesthesia complications

## 2014-08-12 NOTE — Op Note (Signed)
Preoperative diagnosis:  1. Right distal ureteral stone  Postoperative diagnosis:  1. Right distal ureteral stone   Procedure: 1. Right ureteroscopy 2. Basket extraction of ureteral stone Right ureteral stent exchange  Drains: 6 x 26 French double-J ureteral stent on right (string left in place)  Surgeon: Hollice Espy, MD  Anesthesia: General  Complications: None  Intraoperative findings: 2 distal ureteral stones identified and extracted using basket successfully  EBL: Minimal  Specimens: None  Indication: Harold Sosa is a 44 y.o. patient with 6 mm right distal ureteral stone. He previously underwent right ureteral stent placement for pain control and presents today for definitive management of his stone.  After reviewing the management options for treatment, he elected to proceed with the above surgical procedure(s). We have discussed the potential benefits and risks of the procedure, side effects of the proposed treatment, the likelihood of the patient achieving the goals of the procedure, and any potential problems that might occur during the procedure or recuperation. Informed consent has been obtained.  Description of procedure:  The patient was taken to the operating room and general anesthesia was induced.  The patient was placed in the dorsal lithotomy position, prepped and draped in the usual sterile fashion, and preoperative antibiotics were administered. A preoperative time-out was performed.   At this point in time, a 41 French rigid cystoscope was advanced per urethra into the bladder. The distal coil of the right ureteral stent was identified and grasped using stent graspers. This was brought out to the level of the urethral meatus. This is then cannulated using a sensor wire up to the level of the kidney and the stent was removed. The wire was snapped in place as a safety wire. A short semirigid ureteroscope was then brought to be able to be advanced easily through  the right UO. 2 small stones were immediately encountered which appeared the could be easily extracted with the basket. A 1.9 French nitinol tip less basket was then used to extract each of the 2 small pieces without difficulty without trauma. These were passed off the field as stone fragment specimen. The ureteroscope was then advanced level of the midureter no additional stone fragments were identified. It was no trauma identified in the distal ureter but there was some mild papillary edema.  At this point in time, I did elect to replace a 6 x 26 French double-J ureteral stent on a string. This was performed fluoroscopically and the stent was advanced over the wire to the level of the renal pelvis. The wire was partially drawn and a coil was noted within the renal pelvis.  The wire was then fully withdrawn coil was noted within the bladder. Then reintroduced the scope back into the bladder to ensure adequate and appropriate positioning of the distal coil. The bladder was then drained and the scope was removed. The stent string was affixed to the patient's glans using Mastisol and a Tegaderm.  He was then reversed from anesthesia and taken the PACU in stable condition. There were no complications in this case.  Hollice Espy, M.D.  Plan: Patient will remove his own stent in a few days and follow-up in 4 weeks with a renal ultrasound prior.

## 2014-08-12 NOTE — Transfer of Care (Signed)
Immediate Anesthesia Transfer of Care Note  Patient: Harold Sosa  Procedure(s) Performed: Procedure(s): RIGHT URETEROSCOPY WITH BASKET EXTRACTION OF STONE  (Right) CYSTOSCOPY WITH STENT EXCHANGE  (Right)  Patient Location: PACU  Anesthesia Type:General  Level of Consciousness: awake, alert  and oriented  Airway & Oxygen Therapy: Patient Spontanous Breathing and Patient connected to nasal cannula oxygen  Post-op Assessment: Report given to RN and Post -op Vital signs reviewed and stable  Post vital signs: Reviewed and stable  Last Vitals:  Filed Vitals:   08/12/14 0804  BP: 112/77  Pulse: 72  Temp: 36.7 C  Resp: 18    Complications: No apparent anesthesia complications

## 2014-08-12 NOTE — Interval H&P Note (Signed)
History and Physical Interval Note:  08/12/2014 10:10 AM  Harold Sosa  has presented today for surgery, with the diagnosis of RIGHT KIDNEY STONE  The various methods of treatment have been discussed with the patient and family. After consideration of risks, benefits and other options for treatment, the patient has consented to  Procedure(s): URETEROSCOPY WITH HOLMIUM LASER LITHOTRIPSY (Right) CYSTOSCOPY WITH STENT REPLACEMENT (Right) as a surgical intervention .  The patient's history has been reviewed, patient examined, no change in status, stable for surgery.  I have reviewed the patient's chart and labs.  Questions were answered to the patient's satisfaction.     Hollice Espy

## 2014-08-12 NOTE — Anesthesia Procedure Notes (Signed)
Date/Time: 08/12/2014 10:38 AM Performed by: Hollice Espy LMA Size: 5.0

## 2014-08-17 ENCOUNTER — Encounter: Payer: Self-pay | Admitting: Urology

## 2014-09-08 ENCOUNTER — Ambulatory Visit
Admission: RE | Admit: 2014-09-08 | Discharge: 2014-09-08 | Disposition: A | Discharge status: Home / Self Care | Payer: 59 | Source: Ambulatory Visit | Attending: Urology | Admitting: Urology

## 2014-09-08 ENCOUNTER — Other Ambulatory Visit: Payer: Self-pay | Admitting: Urology

## 2014-09-08 DIAGNOSIS — N281 Cyst of kidney, acquired: Secondary | ICD-10-CM | POA: Insufficient documentation

## 2014-09-08 DIAGNOSIS — N200 Calculus of kidney: Secondary | ICD-10-CM | POA: Diagnosis present

## 2014-10-06 ENCOUNTER — Ambulatory Visit: Payer: Self-pay | Admitting: Urology

## 2014-10-29 ENCOUNTER — Ambulatory Visit: Payer: Self-pay | Admitting: Urology

## 2015-07-15 IMAGING — CT CT ABD-PELV W/O CM
3 of 4 series · 9 of 46 positions shown, 16 images · non-contrast
Comparison: None.

CLINICAL DATA: Right flank pain starting 12 a.m.

EXAM:
CT ABDOMEN AND PELVIS WITHOUT CONTRAST
TECHNIQUE: Multidetector CT imaging of the abdomen and pelvis was performed
following the standard protocol without IV contrast.

[Series 4: lung · axial · 0.68mm/px · z∈[-530,-430]mm · 5 of 30 slices shown, 10 images]
[im 5/30  soft-tissue]
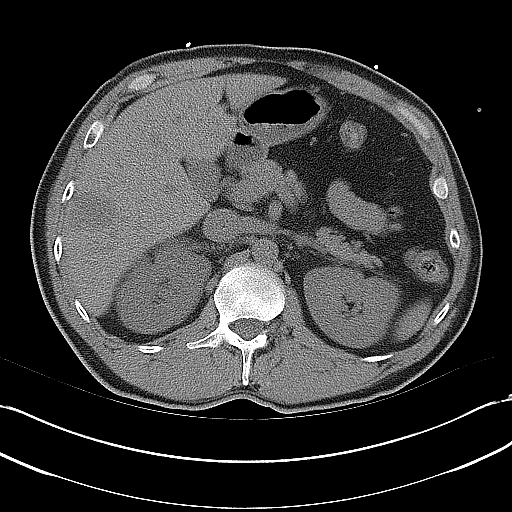
[im 5/30  bone]
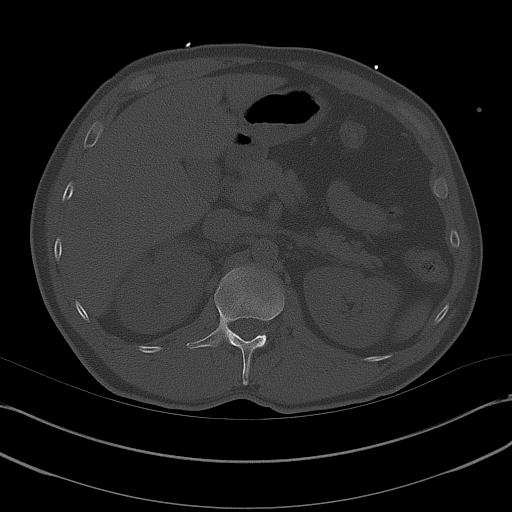
[im 10/30  soft-tissue]
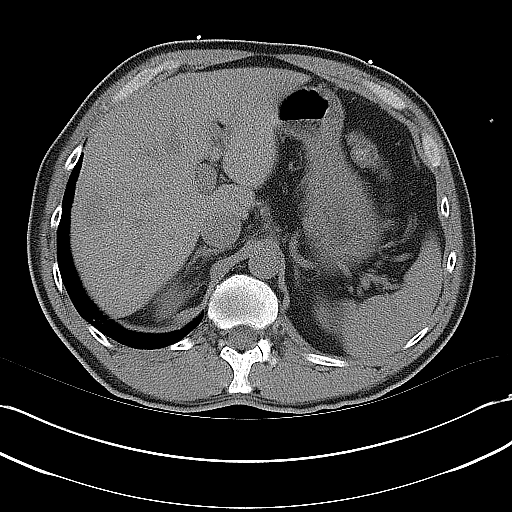
[im 10/30  lung]
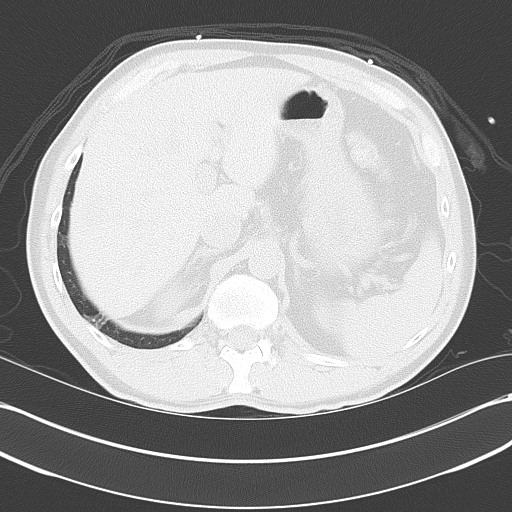
[im 15/30  soft-tissue]
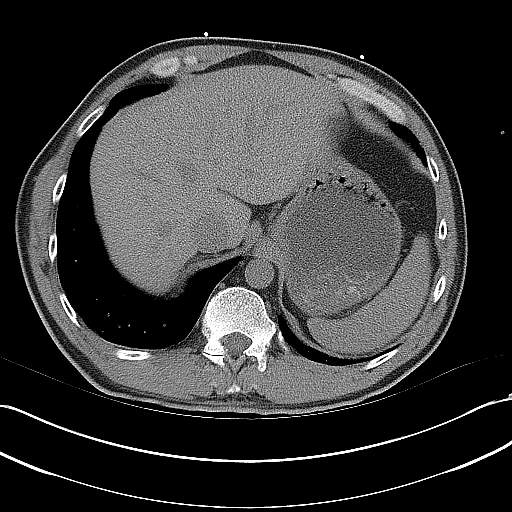
[im 15/30  lung]
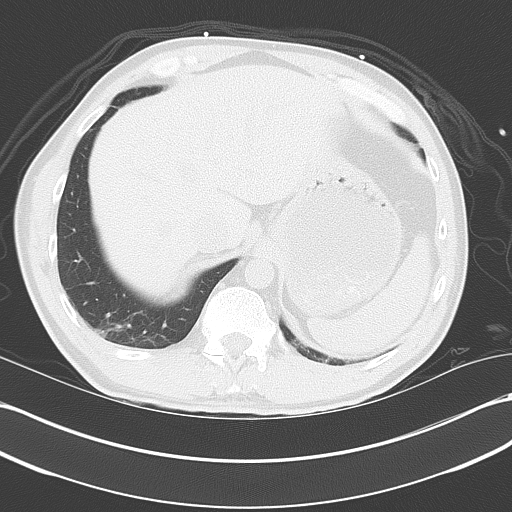
[im 20/30  soft-tissue]
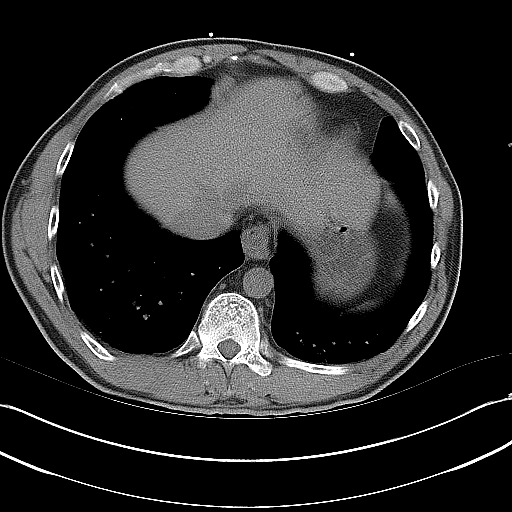
[im 20/30  lung]
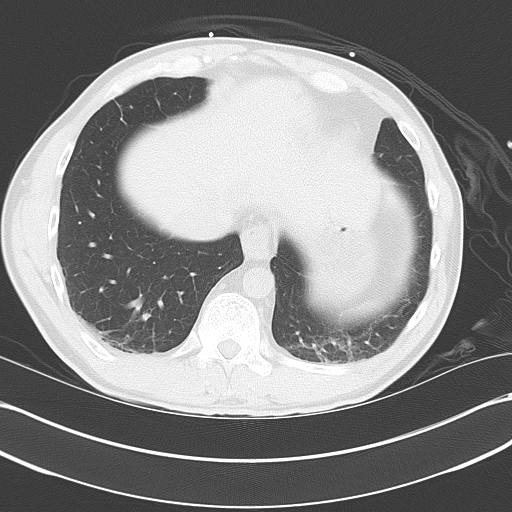
[im 25/30  soft-tissue]
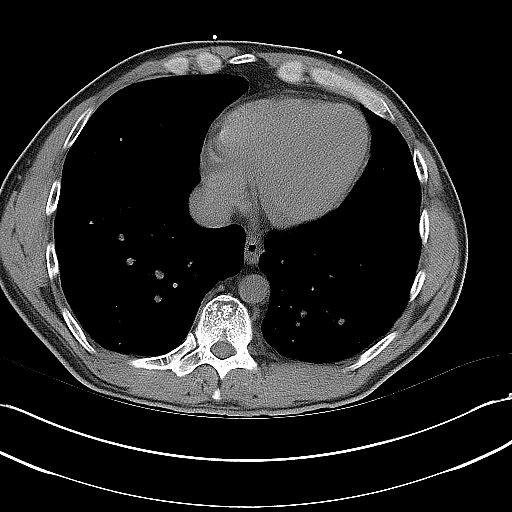
[im 25/30  lung]
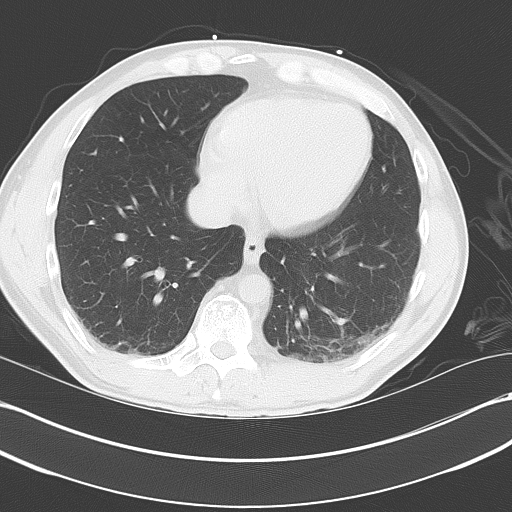

[Series 5: coronal · coronal · 0.67mm/px · 3 of 127 slices shown, 4 images]
[im 43/127  soft-tissue]
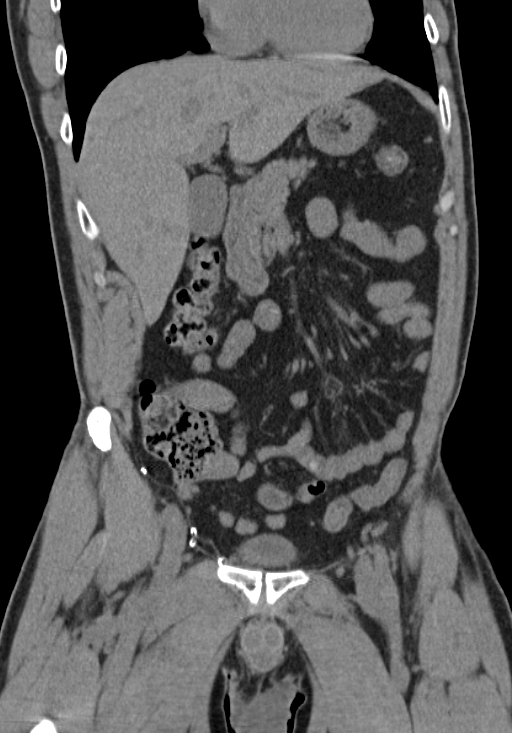
[im 57/127  soft-tissue]
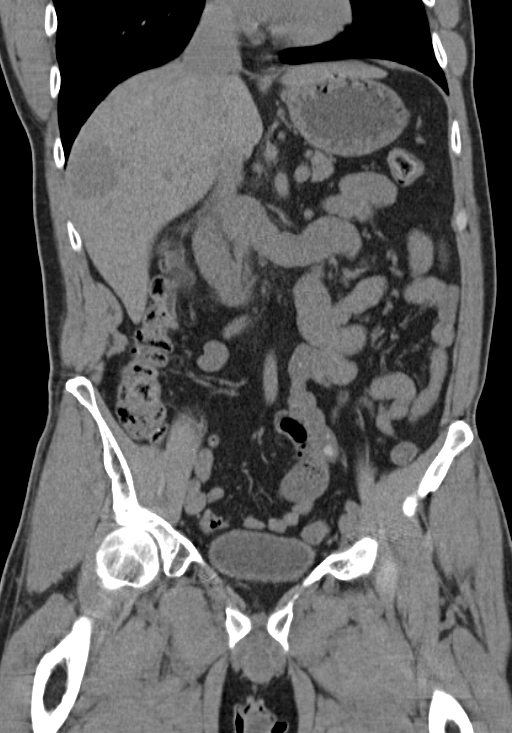
[im 57/127  bone]
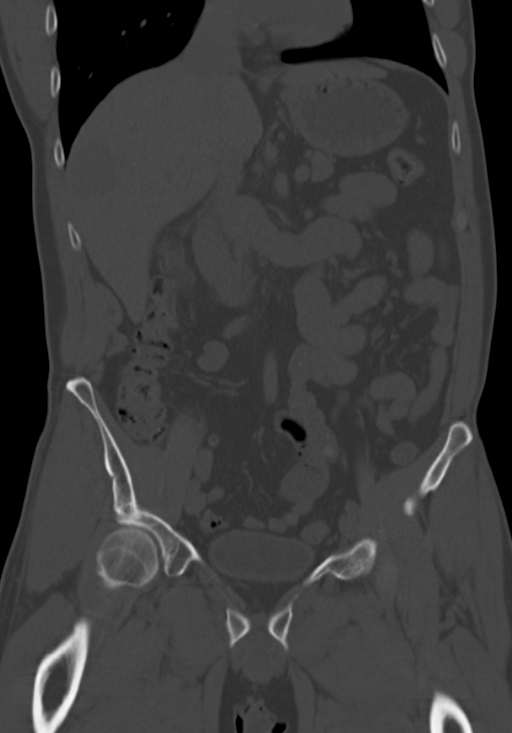
[im 71/127  soft-tissue]
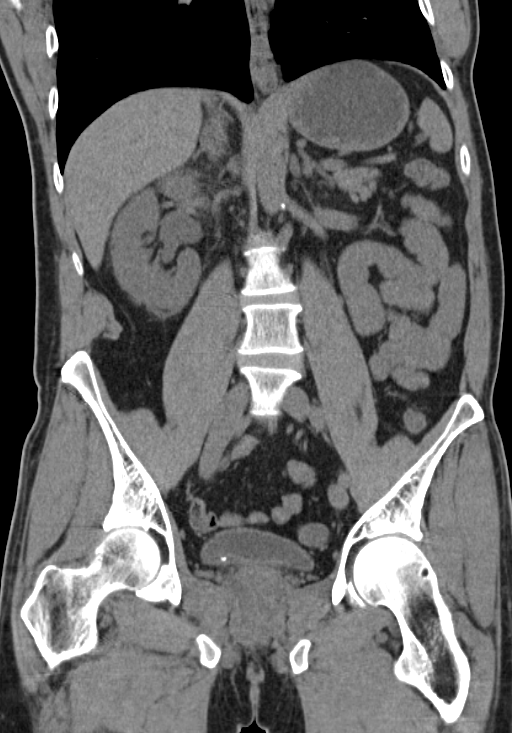

[Series 6: sagittal · sagittal · 0.52mm/px · 1 of 200 slices shown, 2 images]
[im 67/200  soft-tissue]
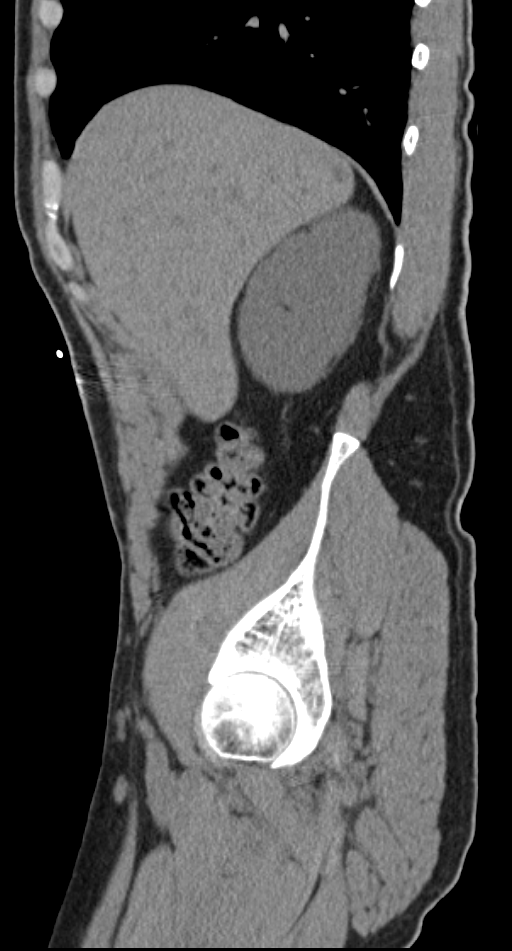
[im 67/200  bone]
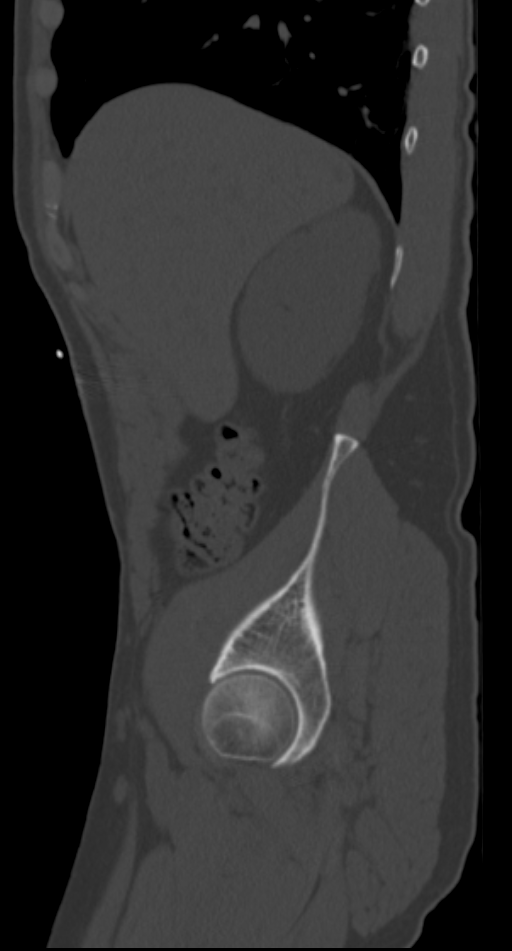

[9 of 46 positions shown; findings below may reference images not displayed]

FINDINGS: Lung bases are unremarkable. Sagittal images of the spine are
unremarkable. Mild levoscoliosis lumbar spine.

Unenhanced liver shows no biliary ductal dilatation. There is
indeterminate low-density lesion in right hepatic lobe measures 4
cm. Further evaluation with enhanced MRI is recommended. Unenhanced
pancreas, spleen and adrenal glands are unremarkable. There is mild
right hydronephrosis and right hydroureter. No nephrolithiasis.
Probable cyst in lower pole of the left kidney measures 9 mm.

Mild right perinephric and periureteral stranding. Mild distension
of distal right ureter. Axial image 76 there is calcified
obstructive calculus in right UVJ/urinary bladder wall measures
about 6 mm.

No aortic aneurysm. No small bowel obstruction. Post hernia repair
changes noted in right inguinal region. No pericecal inflammation.
Normal retrocecal appendix. The terminal ileum is unremarkable. No
ascites or free air. No adenopathy. There is no nephrolithiasis.
IMPRESSION: 1. Mild right hydronephrosis and right hydroureter. Mild right
perinephric and periureteral stranding.
2. There is 6 mm calcified obstructive calculus in right UVJ/urinary
bladder wall.
3. Normal appendix.  No pericecal inflammation.
4. There is indeterminate low-density lesion in right hepatic lobe
measures at least 4 cm. Further evaluation with nonemergent MRI with
IV contrast is recommended.
5. Post hernia repair changes right inguinal region.

## 2016-02-11 ENCOUNTER — Emergency Department
Admission: EM | Admit: 2016-02-11 | Discharge: 2016-02-12 | Disposition: A | Discharge status: Home / Self Care | Payer: 59 | Attending: Student in an Organized Health Care Education/Training Program | Admitting: Student in an Organized Health Care Education/Training Program

## 2016-02-11 ENCOUNTER — Encounter: Payer: Self-pay | Admitting: Emergency Medicine

## 2016-02-11 DIAGNOSIS — L03211 Cellulitis of face: Secondary | ICD-10-CM | POA: Diagnosis not present

## 2016-02-11 DIAGNOSIS — Z79899 Other long term (current) drug therapy: Secondary | ICD-10-CM | POA: Insufficient documentation

## 2016-02-11 DIAGNOSIS — R6884 Jaw pain: Secondary | ICD-10-CM | POA: Diagnosis present

## 2016-02-11 DIAGNOSIS — Z87891 Personal history of nicotine dependence: Secondary | ICD-10-CM | POA: Insufficient documentation

## 2016-02-11 DIAGNOSIS — M899 Disorder of bone, unspecified: Secondary | ICD-10-CM

## 2016-02-11 DIAGNOSIS — K047 Periapical abscess without sinus: Secondary | ICD-10-CM | POA: Insufficient documentation

## 2016-02-11 LAB — CBC WITH DIFFERENTIAL/PLATELET
BASOS PCT: 1 %
Basophils Absolute: 0.1 10*3/uL (ref 0–0.1)
Eosinophils Absolute: 0.2 10*3/uL (ref 0–0.7)
Eosinophils Relative: 2 %
HEMATOCRIT: 37.8 % — AB (ref 40.0–52.0)
Hemoglobin: 13.2 g/dL (ref 13.0–18.0)
Lymphocytes Relative: 16 %
Lymphs Abs: 1.5 10*3/uL (ref 1.0–3.6)
MCH: 31.3 pg (ref 26.0–34.0)
MCHC: 34.9 g/dL (ref 32.0–36.0)
MCV: 89.8 fL (ref 80.0–100.0)
MONOS PCT: 12 %
Monocytes Absolute: 1.1 10*3/uL — ABNORMAL HIGH (ref 0.2–1.0)
NEUTROS ABS: 6.7 10*3/uL — AB (ref 1.4–6.5)
Neutrophils Relative %: 69 %
Platelets: 271 10*3/uL (ref 150–440)
RBC: 4.21 MIL/uL — AB (ref 4.40–5.90)
RDW: 13.2 % (ref 11.5–14.5)
WBC: 9.5 10*3/uL (ref 3.8–10.6)

## 2016-02-11 LAB — COMPREHENSIVE METABOLIC PANEL
ALT: 26 U/L (ref 17–63)
ANION GAP: 6 (ref 5–15)
AST: 22 U/L (ref 15–41)
Albumin: 4.1 g/dL (ref 3.5–5.0)
Alkaline Phosphatase: 60 U/L (ref 38–126)
BUN: 22 mg/dL — ABNORMAL HIGH (ref 6–20)
CHLORIDE: 102 mmol/L (ref 101–111)
CO2: 29 mmol/L (ref 22–32)
Calcium: 9.1 mg/dL (ref 8.9–10.3)
Creatinine, Ser: 0.72 mg/dL (ref 0.61–1.24)
Glucose, Bld: 91 mg/dL (ref 65–99)
Potassium: 4.1 mmol/L (ref 3.5–5.1)
SODIUM: 137 mmol/L (ref 135–145)
Total Bilirubin: 0.4 mg/dL (ref 0.3–1.2)
Total Protein: 7.3 g/dL (ref 6.5–8.1)

## 2016-02-11 MED ORDER — HYDROMORPHONE HCL 1 MG/ML IJ SOLN
1.0000 mg | Freq: Once | INTRAMUSCULAR | Status: AC
  Administered 2016-02-11: 1 mg via INTRAVENOUS
  Filled 2016-02-11: qty 1

## 2016-02-11 MED ORDER — DEXAMETHASONE SODIUM PHOSPHATE 10 MG/ML IJ SOLN
10.0000 mg | Freq: Once | INTRAMUSCULAR | Status: DC
Start: 2016-02-11 — End: 2016-02-11
  Filled 2016-02-11: qty 1

## 2016-02-11 MED ORDER — DEXAMETHASONE SODIUM PHOSPHATE 10 MG/ML IJ SOLN
10.0000 mg | Freq: Once | INTRAMUSCULAR | Status: AC
  Administered 2016-02-11: 10 mg via INTRAVENOUS

## 2016-02-11 NOTE — ED Provider Notes (Signed)
[facility] Emergency Department Provider Note ____________________________________________  Time seen: 2055  I have reviewed the triage vital signs and the nursing notes.  HISTORY  Chief Complaint  Facial Swelling  HPI Harold Sosa is a 45 y.o. male presents to the ED by his wife for evaluation management of increasing pain, and swelling to the left lower jaw. The patient also reports some discomfort with swallowing today as well. The patient reports that he had a tender nodule to the left side of the jaw about 2 months prior. On Sunday (10/29), he began to experience numbness to the lower lip and chin. By Monday, the patient developed significant swelling to the left face and jaw. He had a dental appointment already scheduled for another dental procedure on the calendar for Tuesday (10/31). He presented to the general dentist who performed a panoramic x-ray which revealed a possible abscess to the left lower jaw, but she was unclear which teeth were involved. She referred him to an oral surgeon the same day. The oral surgeon repeated the panoramics, and again was unable to identify the exact tooth involved. He performed an incision and drainage procedure to the lower left jaw, cutting from the second molar to the primary incisor. The patient reports there was no purulent return according to the oral surgeon. He subsequently set the patient up for an outpatient CT scan to further evaluate this area of concern. The patient had planned to follow-up with the oral surgeon directly following the CT scan, only to find the office had closed at midday. The patient presents to the ED due to increased pain & swelling in the jaw and some discomfort with swallowing. He denies any difficulty breathing controlling secretions or fevers. He has been dosing his clindamycin, hydrocodone, as well as OTC Tylenol and ibuprofen.   Past Medical History:  Diagnosis Date  . Kidney stone on right  side     Patient Active Problem List   Diagnosis Date Noted  . Kidney stone 08/12/2014    Past Surgical History:  Procedure Laterality Date  . CYSTOSCOPY W/ URETERAL STENT PLACEMENT Right 07-16-2014  . CYSTOSCOPY W/ URETERAL STENT PLACEMENT Right 08/12/2014   Procedure: CYSTOSCOPY WITH STENT EXCHANGE ;  Surgeon: Hollice Espy, MD;  Location: [facility] ORS;  Service: Urology;  Laterality: Right;  . HERNIA REPAIR     left inguinal hernia  . URETEROSCOPY WITH HOLMIUM LASER LITHOTRIPSY Right 08/12/2014   Procedure: RIGHT URETEROSCOPY WITH BASKET EXTRACTION OF STONE ;  Surgeon: Hollice Espy, MD;  Location: [facility] ORS;  Service: Urology;  Laterality: Right;    Prior to Admission medications   Medication Sig Start Date End Date Taking? Authorizing Provider  cholecalciferol (VITAMIN D) 1000 UNITS tablet Take 5,000 Units by mouth daily.    Historical Provider, MD  clindamycin (CLEOCIN) 300 MG capsule Take 1 capsule (300 mg total) by mouth 4 (four) times daily. 02/12/16 02/16/16  Leta Bucklin V Bacon Fallan Mccarey, PA-C  docusate sodium (COLACE) 100 MG capsule Take 1 capsule (100 mg total) by mouth 2 (two) times daily as needed (take to keep stool soft.). 08/12/14   Hollice Espy, MD  omeprazole (PRILOSEC OTC) 20 MG tablet Take 20 mg by mouth 3 (three) times a week.    Historical Provider, MD  oxyCODONE (ROXICODONE) 5 MG immediate release tablet Take 1 tablet (5 mg total) by mouth every 4 (four) hours as needed. 08/12/14   Hollice Espy, MD  oxyCODONE-acetaminophen (ROXICET) 5-325 MG tablet Take 1 tablet by mouth  every 6 (six) hours as needed for moderate pain or severe pain. 02/12/16   Genessa Beman V Bacon Torra Pala, PA-C  predniSONE (DELTASONE) 10 MG tablet Take 1 tablet (10 mg total) by mouth 2 (two) times daily with a meal. 02/12/16   Della Scrivener V Bacon Aria Pickrell, PA-C  tamsulosin (FLOMAX) 0.4 MG CAPS capsule Take 0.4 mg by mouth daily.    Historical Provider, MD    Allergies Patient has no known allergies.  No family history on  file.  Social History Social History  Substance Use Topics  . Smoking status: Former Smoker    Packs/day: 0.50    Years: 25.00    Types: E-cigarettes  . Smokeless tobacco: Never Used  . Alcohol use No     Comment: once/month    Review of Systems  Constitutional: Negative for fever. Eyes: Negative for visual changes. ENT: Negative for sore throat. Cardiovascular: Negative for chest pain. Respiratory: Negative for shortness of breath. Gastrointestinal: Negative for abdominal pain, vomiting and diarrhea. Genitourinary: Negative for dysuria. Musculoskeletal: Negative for back pain. Skin: Negative for rash. Neurological: Negative for headaches, focal weakness or numbness. ____________________________________________  PHYSICAL EXAM:  VITAL SIGNS: ED Triage Vitals  Enc Vitals Group     BP 02/11/16 2000 (!) 142/87     Pulse Rate 02/11/16 2000 82     Resp 02/11/16 2000 18     Temp 02/11/16 2000 98.5 F (36.9 C)     Temp Source 02/11/16 2000 Oral     SpO2 02/11/16 2000 98 %     Weight 02/11/16 2003 165 lb (74.8 kg)     Height 02/11/16 2003 5\' 8"  (1.727 m)     Head Circumference --      Peak Flow --      Pain Score 02/11/16 2003 9     Pain Loc --      Pain Edu? --      Excl. in [place]? --     Constitutional: Alert and oriented. Well appearing and in no distress. Head: Normocephalic and atraumatic. Eyes: Conjunctivae are normal. PERRL. Normal extraocular movements Ears: Canals clear. TMs intact bilaterally. Nose: No congestion/rhinorrhea/epistaxis. Mouth/Throat: Mucous membranes are moist. Patient with obvious swelling to the lower left mandible. The buccal mucosa shows a firm mass in the lower jaw that extends into the submandibular space. A partially dehisced incision in the buccal space along the canine is noted on the lower left jaw. Uvula is midline, tonsils are flat, and no edema for the sublingual space is appreciate.  Neck: Supple. No  thyromegaly. Hematological/Lymphatic/Immunological: No cervical lymphadenopathy. Cardiovascular: Normal rate, regular rhythm. Normal distal pulses. Respiratory: Normal respiratory effort. No wheezes/rales/rhonchi. Gastrointestinal: Soft and nontender. No distention. Musculoskeletal: Nontender with normal range of motion in all extremities.  Neurologic:  Normal gait without ataxia. Normal speech and language. No gross focal neurologic deficits are appreciated. Skin:  Skin is warm, dry and intact. No rash noted. Psychiatric: Mood and affect are normal. Patient exhibits appropriate insight and judgment. ____________________________________________   LABS (pertinent positives/negatives) Labs Reviewed  COMPREHENSIVE METABOLIC PANEL - Abnormal; Notable for the following:       Result Value   BUN 22 (*)    All other components within normal limits  CBC WITH DIFFERENTIAL/PLATELET - Abnormal; Notable for the following:    RBC 4.21 (*)    HCT 37.8 (*)    Neutro Abs 6.7 (*)    Monocytes Absolute 1.1 (*)    All other components within normal limits  ____________________________________________  RADIOLOGY  Soft Tissue CT w/ contrast 02/11/16 [facility] via [facility] Everywhere)  FINDINGS: The visualized portions of the brain and the posterior fossa are normal.  There is soft tissue swelling overlying the anterior mandible. There is a lytic lesion involving the anterior mandible with absence of the left lateral incisor. There is no clinical information available. Therefore, the differential includes dental disease with or without post operative change with superinfection as well as alveolar ridge carcinoma with loss of the lateral incisor and bone involvement.   The paranasal sinuses, orbits, nasopharynx and oral cavity are normal.The salivary glands, including the parotid glands and submandibular glands, are normal.  The larynx, hypopharynx, and thyroid gland are normal.  There is no  lymphadenopathy.  No bone abnormality is demonstrated.The lung apices are normal.  Normal intravascular enhancement is seen.  Result Impression  --Anterior mandibular bony defect with overlying soft tissue swelling as described above. Recommend direct visualization.  --No adenopathy.  ____________________________________________  PROCEDURES  Dilaudid 1 mg IVP x 2 Decadron 10 mg IV ____________________________________________  INITIAL IMPRESSION / ASSESSMENT AND PLAN / ED COURSE  ----------------------------------------- 9:05 PM on 02/11/2016 ----------------------------------------- S/w Richardson Landry (ENT) He suggest referral to [facility] since there was no drainable abscess on CT. If bone lesion is of concern, higher level of care is necessary.  ----------------------------------------- 10:45 PM on 02/11/2016 ----------------------------------------- S/w Lewanda Rife (DDS) Patient's oral surgeon would like to defer care to ENT for higher level of care based on CT results. ----------------------------------------- 11:23 PM on 02/11/2016 ----------------------------------------- S/w Zigmund Daniel (OMS) @ [facility] He suggested that malignant bone lesions are managed by ENT service at [facility].   ----------------------------------------- 11:45 PM on 02/11/2016 ----------------------------------------- S/w Dr. Priscella Mann (ENT) @ [facility] He suggest that patient be seen by [facility] for dental extraction of probable subclinical dental abscess.  ----------------------------------------- 12:33 AM on 02/12/2016 ----------------------------------------- S/w Dr. Pablo Ledger at [facility] He suggest that since patient is stable, he will be following the patient in the office on Monday.   Clinical Course    Patient with a recent neck CT concerning for lytic lesion in the mandible versus subclinical dental abscess. Patient and his wife are agreeable to the plan of care. They are appreciative for the time spent in securing a  referral source. He will be discharged with prescriptions for oxycodone, prednisone, and increased clindamycin to 300 mg per dose. Return precautions are again reviewed.  ____________________________________________  FINAL CLINICAL IMPRESSION(S) / ED DIAGNOSES  Final diagnoses:  Lytic lesion of bone on x-ray  Dental abscess  Facial cellulitis      Melvenia Needles, PA-C 02/15/16 [place], MD 02/17/16 1622

## 2016-02-11 NOTE — ED Triage Notes (Signed)
Pt states that he went Tuesday to his dentist and that he was referred to an orthopedic surgeon and went same day. Pt states that he was sent for imaging today and has the CD with him, but surgeon office was closed at 1300. Pt states that the surgeon told him that it was not dental issue and ruled out abscess. Pt is in NAD at this time.

## 2016-02-11 NOTE — ED Provider Notes (Signed)
Asked to assess the patient by the PA bacon. The patient has been on clindamycin now for 4 days with increased submandibular swelling to left side. No abscess on CT performed this morning. Patient was seeing an oral surgeon locally but the office closed related today and the patient was unable to follow-up after his CAT scan. Patient now having some difficulty swallowing. ENT has been consult the by the PA and they're recommending outpatient follow-up with maxillofacial surgery because there is no abscess on the drain. Upon examination the patient does have a hard and warm massive swollen tissue below the left as well as medial to the left mandibular angle. It is tender to palpation. There is no fluctuance. There is no brawny edema to the floor of the mouth.  Because of the patient's failure of outpatient antibiotics as well as his increased swelling and difficulty swallowing, I recommended that PA bacon call maxillofacial surgery and  Likely transfer the patient. The patient has a stable airway at this time and is controlling secretions as being in a normal voice.    Orbie Pyo, MD 02/11/16 [id number]

## 2016-02-12 MED ORDER — PREDNISONE 10 MG PO TABS: 10.0000 mg | ORAL_TABLET | Freq: Two times a day (BID) | ORAL | 0 refills | Status: AC

## 2016-02-12 MED ORDER — OXYCODONE-ACETAMINOPHEN 5-325 MG PO TABS: 1.0000 | ORAL_TABLET | Freq: Four times a day (QID) | ORAL | 0 refills | Status: AC | PRN

## 2016-02-12 MED ORDER — CLINDAMYCIN PHOSPHATE 600 MG/50ML IV SOLN
INTRAVENOUS | Status: AC
  Administered 2016-02-12: 600 mg via INTRAVENOUS
  Filled 2016-02-12: qty 50

## 2016-02-12 MED ORDER — CLINDAMYCIN PHOSPHATE 600 MG/50ML IV SOLN
600.0000 mg | Freq: Once | INTRAVENOUS | Status: AC
  Administered 2016-02-12: 600 mg via INTRAVENOUS

## 2016-02-12 MED ORDER — CLINDAMYCIN HCL 300 MG PO CAPS: 300.0000 mg | ORAL_CAPSULE | Freq: Four times a day (QID) | ORAL | 0 refills | Status: AC

## 2016-02-12 NOTE — Discharge Instructions (Signed)
You are being treated for a potential dental abscess/cellulitis and bone lesion. You are scheduled to see Dr. Pablo Ledger at [facility] on Monday. Call his office on Monday morning and be prepared to present yourself for evaluation. Bring your CD-ROM for review. Take the antibiotic as directed. Take the pain medicines as prescribed. Return immediately to the nearest ED for any increased pain, swelling, or difficulty swallowing.

## 2016-02-12 NOTE — ED Notes (Signed)
With PA at bedside to update patient.

## 2016-02-12 NOTE — ED Notes (Signed)
Pt alert and oriented X4, active, cooperative, pt in NAD. RR even and unlabored, color WNL.  Pt informed to return if any life threatening symptoms occur.   

## 2016-05-22 IMAGING — US US RENAL
1 series · 14 of 25 positions shown · non-contrast
Comparison: 07/16/2014

CLINICAL DATA: Right ureteral stone status post treatment with
laser.

EXAM:
RENAL / URINARY TRACT ULTRASOUND COMPLETE

[Series 1: us renal · 0.26mm/px · 14 of 66 slices shown]
[im 1/66]
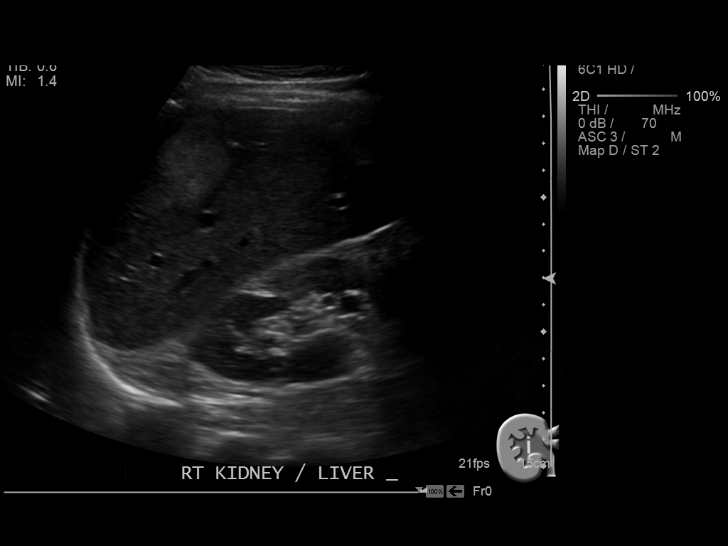
[im 6/66]
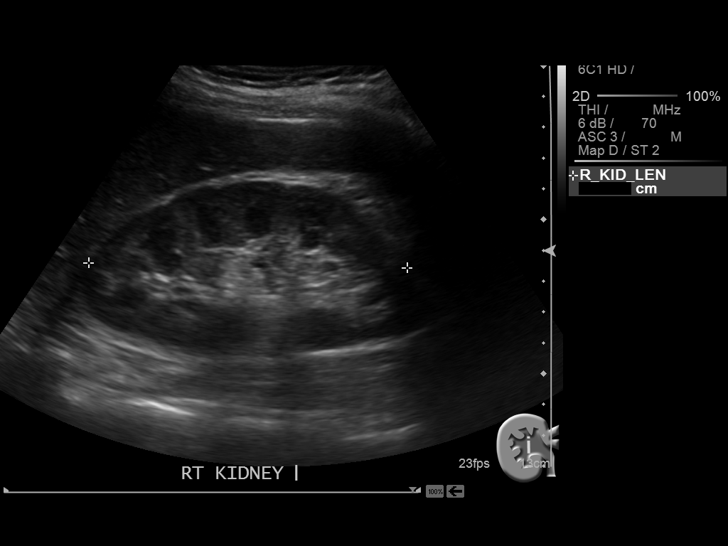
[im 11/66]
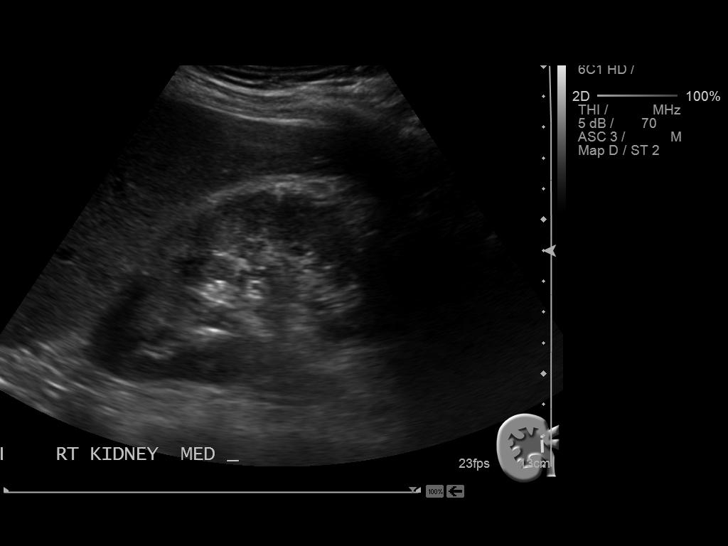
[im 17/66]
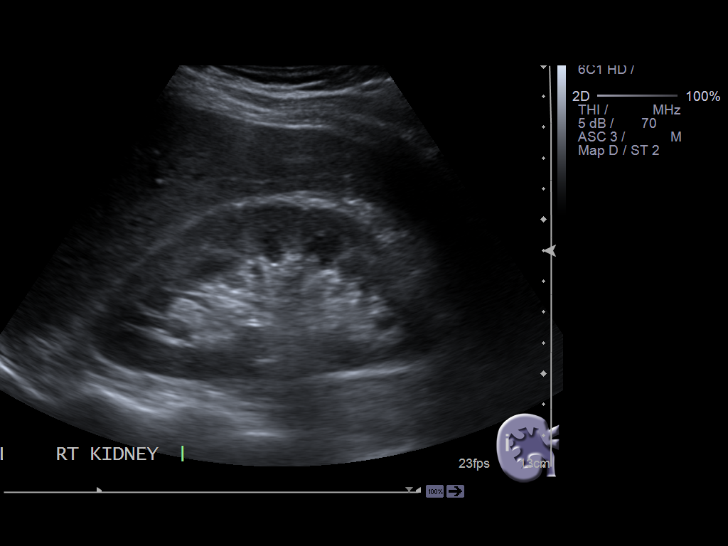
[im 22/66]
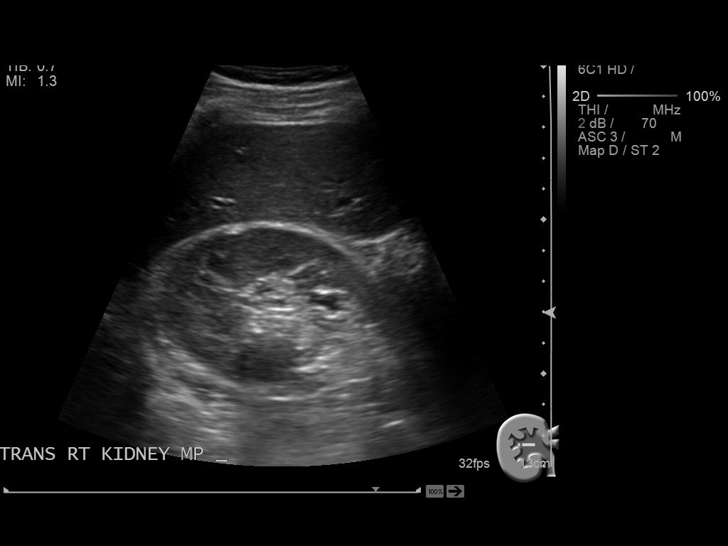
[im 25/66]
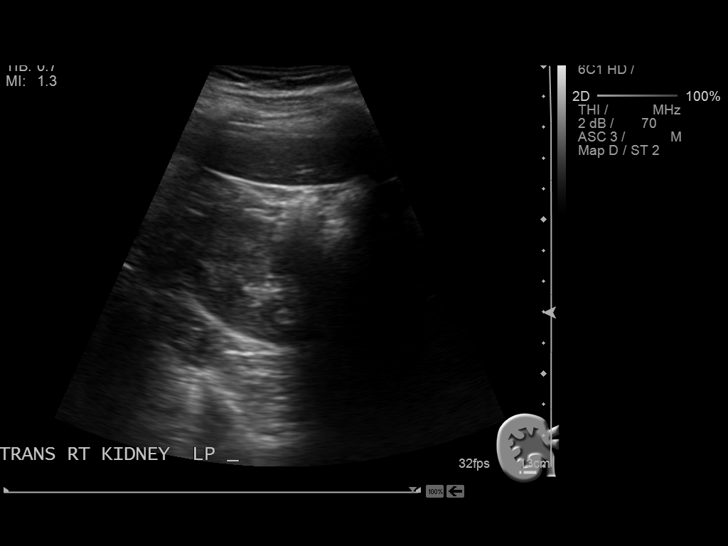
[im 30/66]
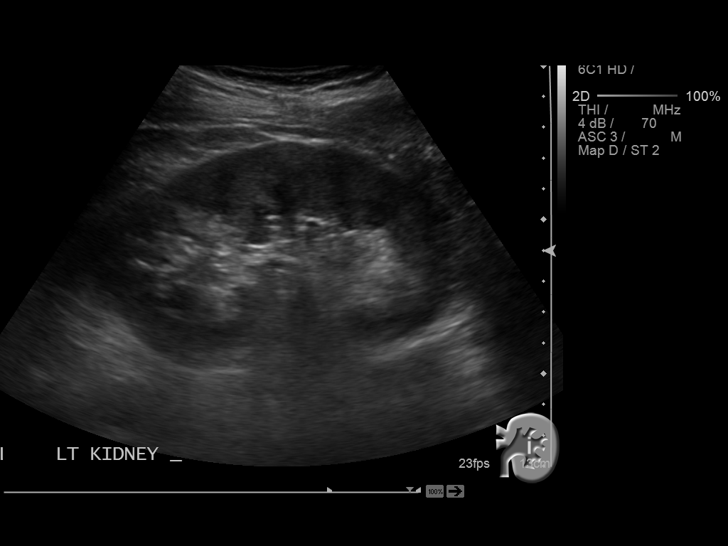
[im 36/66]
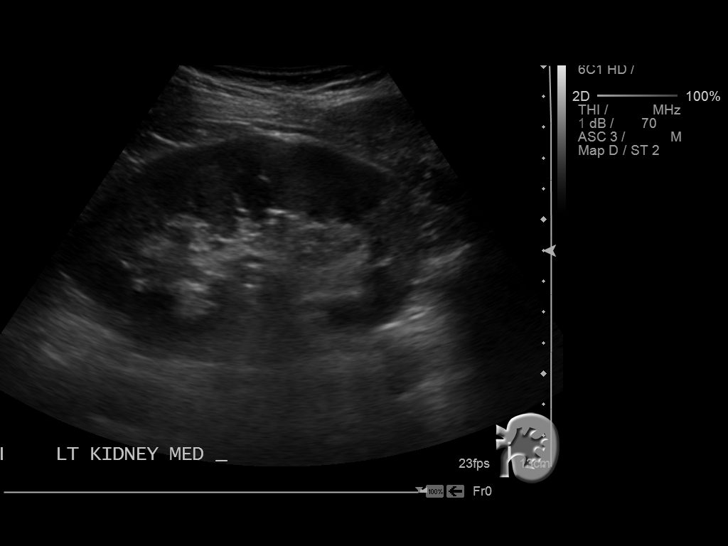
[im 41/66]
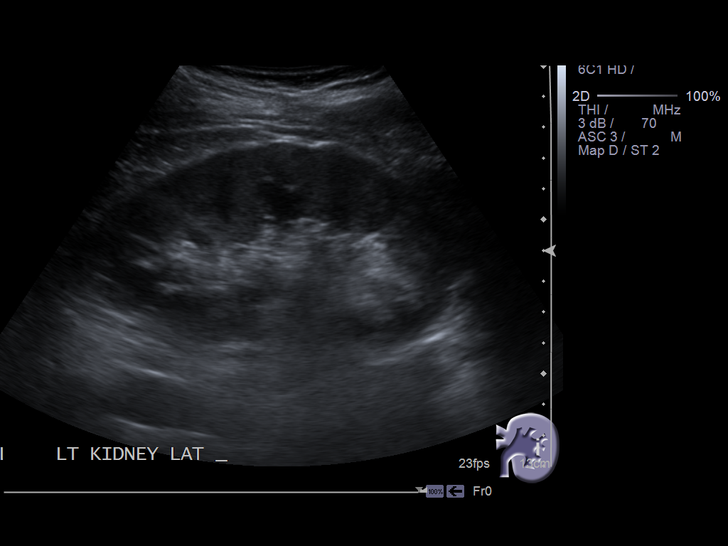
[im 44/66]
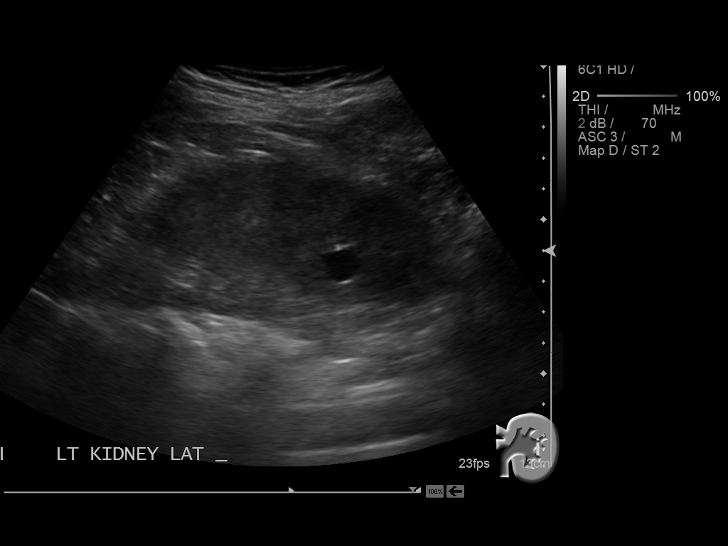
[im 49/66]
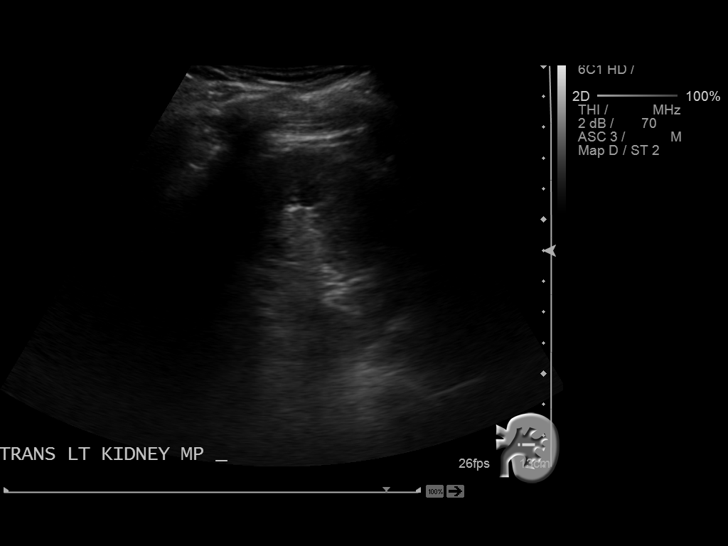
[im 55/66]
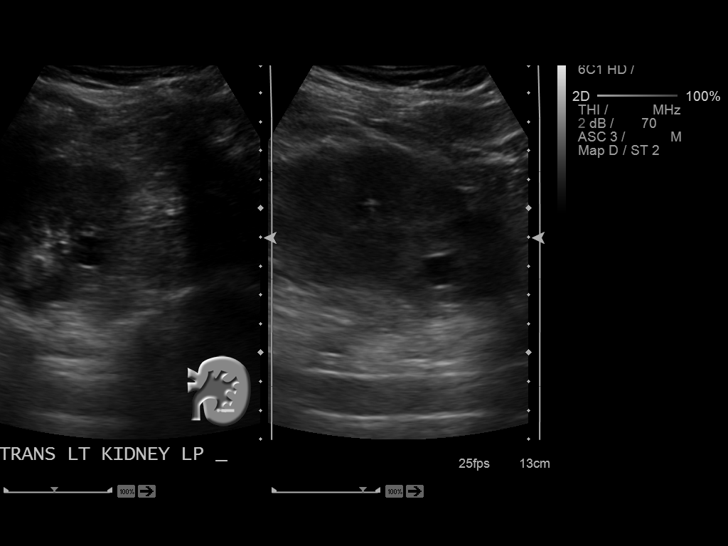
[im 60/66]
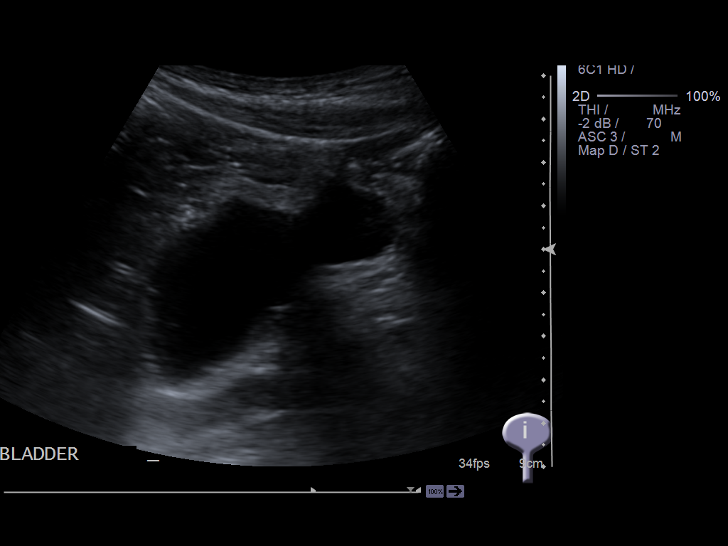
[im 66/66]
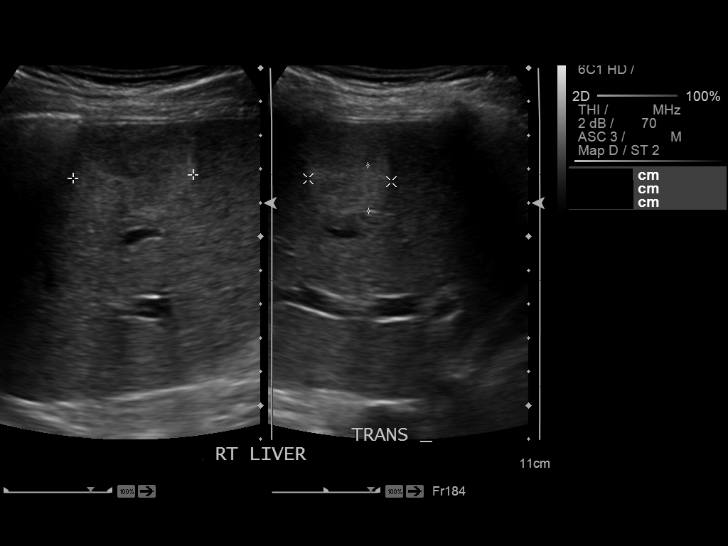

[14 of 25 positions shown; findings below may reference images not displayed]

FINDINGS: Right Kidney:

Length: 10.3 cm. Echogenicity within normal limits. No mass or
hydronephrosis visualized.

Left Kidney:

Length: 11.6 cm. There is a cyst within the inferior pole of the
left kidney measuring 1.1 x 0.9 x 1.1 cm.. Echogenicity within
normal limits. No mass or hydronephrosis visualized.

Bladder:

Appears normal for degree of bladder distention.
IMPRESSION: 1. No acute findings.  No nephrolithiasis or hydronephrosis noted.
2. Left kidney cyst.

## 2021-06-01 ENCOUNTER — Other Ambulatory Visit: Payer: Self-pay

## 2021-06-01 ENCOUNTER — Telehealth: Payer: Self-pay

## 2021-06-01 NOTE — Telephone Encounter (Signed)
CALLED PATIENT NO ANSWER LEFT VOICEMAIL FOR A CALL BACK ? ?

## 2021-06-02 ENCOUNTER — Other Ambulatory Visit: Payer: Self-pay

## 2021-06-02 DIAGNOSIS — Z1211 Encounter for screening for malignant neoplasm of colon: Secondary | ICD-10-CM

## 2021-06-02 MED ORDER — CLENPIQ 10-3.5-12 MG-GM -GM/160ML PO SOLN: 1.0000 | ORAL | 0 refills | Status: AC

## 2021-06-02 NOTE — Progress Notes (Signed)
Gastroenterology Pre-Procedure Review  Request Date: 07/08/2021 Requesting Physician: Dr. Vicente Males  PATIENT REVIEW QUESTIONS: The patient responded to the following health history questions as indicated:    1. Are you having any GI issues? no 2. Do you have a personal history of Polyps? no 3. Do you have a family history of Colon Cancer or Polyps? no 4. Diabetes Mellitus? no 5. Joint replacements in the past 12 months?no 6. Major health problems in the past 3 months?no 7. Any artificial heart valves, MVP, or defibrillator?no    MEDICATIONS & ALLERGIES:    Patient reports the following regarding taking any anticoagulation/antiplatelet therapy:   Plavix, Coumadin, Eliquis, Xarelto, Lovenox, Pradaxa, Brilinta, or Effient? no Aspirin? yes (81 mg)  Patient confirms/reports the following medications:  Current Outpatient Medications  Medication Sig Dispense Refill   cholecalciferol (VITAMIN D) 1000 UNITS tablet Take 5,000 Units by mouth daily.     docusate sodium (COLACE) 100 MG capsule Take 1 capsule (100 mg total) by mouth 2 (two) times daily as needed (take to keep stool soft.). 60 capsule 0   omeprazole (PRILOSEC OTC) 20 MG tablet Take 20 mg by mouth 3 (three) times a week.     oxyCODONE (ROXICODONE) 5 MG immediate release tablet Take 1 tablet (5 mg total) by mouth every 4 (four) hours as needed. 15 tablet 0   oxyCODONE-acetaminophen (ROXICET) 5-325 MG tablet Take 1 tablet by mouth every 6 (six) hours as needed for moderate pain or severe pain. 20 tablet 0   predniSONE (DELTASONE) 10 MG tablet Take 1 tablet (10 mg total) by mouth 2 (two) times daily with a meal. 10 tablet 0   tamsulosin (FLOMAX) 0.4 MG CAPS capsule Take 0.4 mg by mouth daily.     No current facility-administered medications for this visit.    Patient confirms/reports the following allergies:  No Known Allergies  No orders of the defined types were placed in this encounter.   AUTHORIZATION INFORMATION Primary  Insurance: 1D#: Group #:  Secondary Insurance: 1D#: Group #:  SCHEDULE INFORMATION: Date: 07/08/2021 Time: Location:[facility]

## 2021-07-08 ENCOUNTER — Ambulatory Visit
Admission: RE | Admit: 2021-07-08 | Discharge: 2021-07-08 | Disposition: A | Discharge status: Home / Self Care | Payer: 59 | Attending: Gastroenterology | Admitting: Gastroenterology

## 2021-07-08 ENCOUNTER — Ambulatory Visit: Payer: 59 | Admitting: Anesthesiology

## 2021-07-08 ENCOUNTER — Encounter
Admission: RE | Disposition: A | Discharge status: Home / Self Care | Payer: Self-pay | Source: Home / Self Care | Attending: Gastroenterology

## 2021-07-08 DIAGNOSIS — D124 Benign neoplasm of descending colon: Secondary | ICD-10-CM | POA: Diagnosis not present

## 2021-07-08 DIAGNOSIS — K635 Polyp of colon: Secondary | ICD-10-CM

## 2021-07-08 DIAGNOSIS — K219 Gastro-esophageal reflux disease without esophagitis: Secondary | ICD-10-CM | POA: Diagnosis not present

## 2021-07-08 DIAGNOSIS — D123 Benign neoplasm of transverse colon: Secondary | ICD-10-CM | POA: Diagnosis not present

## 2021-07-08 DIAGNOSIS — Z1211 Encounter for screening for malignant neoplasm of colon: Secondary | ICD-10-CM | POA: Insufficient documentation

## 2021-07-08 HISTORY — PX: COLONOSCOPY WITH PROPOFOL: SHX5780

## 2021-07-08 SURGERY — COLONOSCOPY WITH PROPOFOL
Anesthesia: General

## 2021-07-08 MED ORDER — SODIUM CHLORIDE 0.9 % IV SOLN: INTRAVENOUS | Status: DC

## 2021-07-08 MED ORDER — PROPOFOL 10 MG/ML IV BOLUS
INTRAVENOUS | Status: DC | PRN
Start: 2021-07-08 — End: 2021-07-08
  Administered 2021-07-08: 70 mg via INTRAVENOUS
  Administered 2021-07-08: 30 mg via INTRAVENOUS

## 2021-07-08 MED ORDER — PROPOFOL 500 MG/50ML IV EMUL
INTRAVENOUS | Status: DC | PRN
  Administered 2021-07-08: 200 ug/kg/min via INTRAVENOUS

## 2021-07-08 NOTE — Anesthesia Postprocedure Evaluation (Signed)
Anesthesia Post Note ? ?Patient: Harold Sosa ? ?Procedure(s) Performed: COLONOSCOPY WITH PROPOFOL ? ?Patient location during evaluation: PACU ?Anesthesia Type: General ?Level of consciousness: awake and alert ?Pain management: pain level controlled ?Vital Signs Assessment: post-procedure vital signs reviewed and stable ?Respiratory status: spontaneous breathing, nonlabored ventilation and respiratory function stable ?Cardiovascular status: blood pressure returned to baseline and stable ?Postop Assessment: no apparent nausea or vomiting ?Anesthetic complications: no ? ? ?No notable events documented. ? ? ?Last Vitals:  ?Vitals:  ? 07/08/21 1039 07/08/21 1049  ?BP: 104/67 111/72  ?Pulse: 72 70  ?Resp: 20 16  ?Temp:    ?SpO2: 98% 98%  ?  ?Last Pain:  ?Vitals:  ? 07/08/21 1049  ?TempSrc:   ?PainSc: 0-No pain  ? ? ?  ?  ?  ?  ?  ?  ? ?[place] Ouch ? ? ? ? ?

## 2021-07-08 NOTE — Anesthesia Preprocedure Evaluation (Addendum)
Anesthesia Evaluation  ?Patient identified by MRN, date of birth, ID band ?Patient awake ? ? ? ?Reviewed: ?Allergy & Precautions, NPO status , Patient's Chart, lab work & pertinent test results ? ?Airway ?Mallampati: II ? ?TM Distance: >3 FB ?Neck ROM: full ? ? ? Dental ?no notable dental hx. ? ?  ?Pulmonary ?Current SmokerPatient did not abstain from smoking.,  ?  ?Pulmonary exam normal ? ? ? ? ? ? ? Cardiovascular ?negative cardio ROS ?Normal cardiovascular exam ? ? ?  ?Neuro/Psych ?negative neurological ROS ? negative psych ROS  ? GI/Hepatic ?Neg liver ROS, GERD  Medicated and Controlled,  ?Endo/Other  ?negative endocrine ROS ? Renal/GU ?negative Renal ROS  ?negative genitourinary ?  ?Musculoskeletal ? ? Abdominal ?Normal abdominal exam  (+)   ?Peds ? Hematology ?negative hematology ROS ?(+)   ?Anesthesia Other Findings ?Past Medical History: ?No date: Kidney stone on right side ? ?Past Surgical History: ?07-16-2014: CYSTOSCOPY W/ URETERAL STENT PLACEMENT; Right ?08/12/2014: CYSTOSCOPY W/ URETERAL STENT PLACEMENT; Right ?    Comment:  Procedure: CYSTOSCOPY WITH STENT EXCHANGE ;  Surgeon:  ?             Hollice Espy, MD;  Location: [facility] ORS;  Service:  ?             Urology;  Laterality: Right; ?No date: HERNIA REPAIR ?    Comment:  left inguinal hernia ?08/12/2014: URETEROSCOPY WITH HOLMIUM LASER LITHOTRIPSY; Right ?    Comment:  Procedure: RIGHT URETEROSCOPY WITH BASKET EXTRACTION OF  ?             STONE ;  Surgeon: Hollice Espy, MD;  Location: [place]  ?             ORS;  Service: Urology;  Laterality: Right; ? ?BMI   ? Body Mass Index: 28.98 kg/m?  ?  ? ? Reproductive/Obstetrics ?negative OB ROS ? ?  ? ? ? ? ? ? ? ? ? ? ? ? ? ?  ?  ? ? ? ? ? ? ? ?Anesthesia Physical ?Anesthesia Plan ? ?ASA: 2 ? ?Anesthesia Plan: General  ? ?Post-op Pain Management:   ? ?Induction:  ? ?PONV Risk Score and Plan: Propofol infusion and TIVA ? ?Airway Management Planned: Natural Airway ? ?Additional  Equipment:  ? ?Intra-op Plan:  ? ?Post-operative Plan:  ? ?Informed Consent: I have reviewed the patients History and Physical, chart, labs and discussed the procedure including the risks, benefits and alternatives for the proposed anesthesia with the patient or authorized representative who has indicated his/her understanding and acceptance.  ? ? ? ?Dental advisory given ? ?Plan Discussed with: Anesthesiologist, CRNA and Surgeon ? ?Anesthesia Plan Comments:   ? ? ? ? ? ?Anesthesia Quick Evaluation ? ?

## 2021-07-08 NOTE — Transfer of Care (Signed)
Immediate Anesthesia Transfer of Care Note ? ?Patient: Harold Sosa ? ?Procedure(s) Performed: COLONOSCOPY WITH PROPOFOL ? ?Patient Location: PACU ? ?Anesthesia Type:General ? ?Level of Consciousness: sedated ? ?Airway & Oxygen Therapy: Patient Spontanous Breathing ? ?Post-op Assessment: Report given to RN and Post -op Vital signs reviewed and stable ? ?Post vital signs: Reviewed and stable ? ?Last Vitals:  ?Vitals Value Taken Time  ?BP 98/68 07/08/21 1029  ?Temp 36.3 ?C 07/08/21 1029  ?Pulse 67 07/08/21 1031  ?Resp 14 07/08/21 1031  ?SpO2 96 % 07/08/21 1031  ?Vitals shown include unvalidated device data. ? ?Last Pain:  ?Vitals:  ? 07/08/21 1029  ?TempSrc: Temporal  ?PainSc: Asleep  ?   ? ?  ? ?Complications: No notable events documented. ?

## 2021-07-08 NOTE — H&P (Signed)
? ? ? ?Jonathon Bellows, MD ?[address] [place], [place], [place], [address] ?[address] [place], [place], [place], [address] ?Phone: [phone]  ?Fax: [phone] ? ?Primary Care Physician:  Carmon Ginsberg, PA ? ? ?Pre-Procedure History & Physical: ?HPI:  Harold Sosa is a 51 y.o. male is here for an colonoscopy. ?  ?Past Medical History:  ?Diagnosis Date  ? Kidney stone on right side   ? ? ?Past Surgical History:  ?Procedure Laterality Date  ? CYSTOSCOPY W/ URETERAL STENT PLACEMENT Right 07-16-2014  ? CYSTOSCOPY W/ URETERAL STENT PLACEMENT Right 08/12/2014  ? Procedure: CYSTOSCOPY WITH STENT EXCHANGE ;  Surgeon: Hollice Espy, MD;  Location: [facility] ORS;  Service: Urology;  Laterality: Right;  ? HERNIA REPAIR    ? left inguinal hernia  ? URETEROSCOPY WITH HOLMIUM LASER LITHOTRIPSY Right 08/12/2014  ? Procedure: RIGHT URETEROSCOPY WITH BASKET EXTRACTION OF STONE ;  Surgeon: Hollice Espy, MD;  Location: [facility] ORS;  Service: Urology;  Laterality: Right;  ? ? ?Prior to Admission medications   ?Medication Sig Start Date End Date Taking? Authorizing Provider  ?atorvastatin (LIPITOR) 10 MG tablet Take 10 mg by mouth daily.   Yes [provider]  ?cholecalciferol (VITAMIN D) 1000 UNITS tablet Take 5,000 Units by mouth daily.    [provider]  ?docusate sodium (COLACE) 100 MG capsule Take 1 capsule (100 mg total) by mouth 2 (two) times daily as needed (take to keep stool soft.). 08/12/14   Hollice Espy, MD  ?omeprazole (PRILOSEC OTC) 20 MG tablet Take 20 mg by mouth 3 (three) times a week.    [provider]  ?oxyCODONE (ROXICODONE) 5 MG immediate release tablet Take 1 tablet (5 mg total) by mouth every 4 (four) hours as needed. 08/12/14   Hollice Espy, MD  ?oxyCODONE-acetaminophen (ROXICET) 5-325 MG tablet Take 1 tablet by mouth every 6 (six) hours as needed for moderate pain or severe pain. 02/12/16   Menshew, Dannielle Karvonen, PA-C  ?predniSONE (DELTASONE) 10 MG tablet Take 1 tablet (10  mg total) by mouth 2 (two) times daily with a meal. 02/12/16   Menshew, Dannielle Karvonen, PA-C  ?Sod Picosulfate-Mag Ox-Cit Acd (CLENPIQ) 10-3.5-12 MG-GM -GM/160ML SOLN Take 1 kit by mouth as directed. At 5 PM evening before procedure, drink 1 bottle of Clenpiq, hydrate, drink (5) 8 oz of water. Then do the same thing 5 hours prior to your procedure. 06/02/21   Jonathon Bellows, MD  ?tamsulosin (FLOMAX) 0.4 MG CAPS capsule Take 0.4 mg by mouth daily. ?Patient not taking: Reported on 07/08/2021    [provider]  ? ? ?Allergies as of 06/02/2021 - Review Complete 06/02/2021  ?Allergen Reaction Noted  ? Penicillins  06/02/2021  ? ? ?No family history on file. ? ?Social History  ? ?Socioeconomic History  ? Marital status: Married  ?  Spouse name: Not on file  ? Number of children: Not on file  ? Years of education: Not on file  ? Highest education level: Not on file  ?Occupational History  ? Not on file  ?Tobacco Use  ? Smoking status: Former  ?  Packs/day: 0.50  ?  Years: 25.00  ?  Pack years: 12.50  ?  Types: E-cigarettes, Cigarettes  ? Smokeless tobacco: Never  ?Substance and Sexual Activity  ? Alcohol use: No  ?  Comment: once/month  ? Drug use: No  ? Sexual activity: Yes  ?Other Topics Concern  ? Not on file  ?Social History Narrative  ?  Not on file  ? ?Social Determinants of Health  ? ?Financial Resource Strain: Not on file  ?Food Insecurity: Not on file  ?Transportation Needs: Not on file  ?Physical Activity: Not on file  ?Stress: Not on file  ?Social Connections: Not on file  ?Intimate Partner Violence: Not on file  ? ? ?Review of Systems: ?See HPI, otherwise negative ROS ? ?Physical Exam: ?BP 123/90   Pulse 61   Temp 97.8 ?F (36.6 ?C) (Temporal)   Resp 16   Ht _0  (1.702 m)   Wt 83.9 kg   SpO2 99%   BMI 28.98 kg/m?  ?General:   Alert,  pleasant and cooperative in NAD ?Head:  Normocephalic and atraumatic. ?Neck:  Supple; no masses or thyromegaly. ?Lungs:  Clear throughout to auscultation, normal  respiratory effort.    ?Heart:  +S1, +S2, Regular rate and rhythm, No edema. ?Abdomen:  Soft, nontender and nondistended. Normal bowel sounds, without guarding, and without rebound.   ?Neurologic:  Alert and  oriented x4;  grossly normal neurologically. ? ?Impression/Plan: ?Harold Sosa is here for an colonoscopy to be performed for Screening colonoscopy average risk   ?Risks, benefits, limitations, and alternatives regarding  colonoscopy have been reviewed with the patient.  Questions have been answered.  All parties agreeable. ? ? ?Jonathon Bellows, MD  07/08/2021, 9:58 AM ? ?

## 2021-07-08 NOTE — Op Note (Signed)
[facility] ?Gastroenterology ?Patient Name: Harold Sosa ?Procedure Date: 07/08/2021 10:04 AM ?MRN: [id number] ?Account #: [id number] ?Date of Birth: Aug 13, 1970 ?Admit Type: Outpatient ?Age: 51 ?Room: [facility] ENDO ROOM 3 ?Gender: Male ?Note Status: Finalized ?Instrument Name: Colonoscope [id number] ?Procedure:             Colonoscopy ?Indications:           Screening for colorectal malignant neoplasm ?Providers:             Jonathon Bellows MD, MD ?Referring MD:          Forest Gleason Md, MD (Referring MD) ?Medicines:             Monitored Anesthesia Care ?Complications:         No immediate complications. ?Procedure:             Pre-Anesthesia Assessment: ?                       - Prior to the procedure, a History and Physical was  ?                       performed, and patient medications, allergies and  ?                       sensitivities were reviewed. The patient's tolerance  ?                       of previous anesthesia was reviewed. ?                       - The risks and benefits of the procedure and the  ?                       sedation options and risks were discussed with the  ?                       patient. All questions were answered and informed  ?                       consent was obtained. ?                       - ASA Grade Assessment: II - A patient with mild  ?                       systemic disease. ?                       - ASA Grade Assessment: II - A patient with mild  ?                       systemic disease. ?                       After obtaining informed consent, the colonoscope was  ?                       passed under direct vision. Throughout the procedure,  ?                       the patient's blood  pressure, pulse, and oxygen  ?                       saturations were monitored continuously. The  ?                       Colonoscope was introduced through the anus and  ?                       advanced to the the cecum, identified by the  ?                       appendiceal  orifice. The colonoscopy was performed  ?                       with ease. The patient tolerated the procedure well.  ?                       The quality of the bowel preparation was excellent. ?Findings: ?     The perianal and digital rectal examinations were normal. ?     A 5 mm polyp was found in the ascending colon. The polyp was sessile.  ?     The polyp was removed with a cold snare. Resection and retrieval were  ?     complete. ?     A 5 mm polyp was found in the descending colon. The polyp was sessile.  ?     The polyp was removed with a cold snare. Resection and retrieval were  ?     complete. To prevent bleeding after the polypectomy, one hemostatic clip  ?     was successfully placed. There was no bleeding at the end of the  ?     procedure. ?     The exam was otherwise without abnormality on direct and retroflexion  ?     views. ?Impression:            - One 5 mm polyp in the ascending colon, removed with  ?                       a cold snare. Resected and retrieved. ?                       - One 5 mm polyp in the descending colon, removed with  ?                       a cold snare. Resected and retrieved. Clip was placed. ?                       - The examination was otherwise normal on direct and  ?                       retroflexion views. ?Recommendation:        - Discharge patient to home. ?                       - Resume previous diet. ?                       - Continue present medications. ?                       -  Await pathology results. ?                       - Repeat colonoscopy for surveillance based on  ?                       pathology results. ?Procedure Code(s):     --- Professional --- ?                       [phone], Colonoscopy, flexible; with removal of  ?                       tumor(s), polyp(s), or other lesion(s) by snare  ?                       technique ?Diagnosis Code(s):     --- Professional --- ?                       Z12.11, Encounter for screening for malignant neoplasm  ?                        of colon ?                       K63.5, Polyp of colon ?CPT copyright 2019 [employer] Association. All rights reserved. ?The codes documented in this report are preliminary and upon coder review may  ?be revised to meet current compliance requirements. ?Jonathon Bellows, MD ?Jonathon Bellows MD, MD ?07/08/2021 10:30:34 AM ?This report has been signed electronically. ?Number of Addenda: 0 ?Note Initiated On: 07/08/2021 10:04 AM ?Scope Withdrawal Time: 0 hours 14 minutes 55 seconds  ?Total Procedure Duration: 0 hours 18 minutes 8 seconds  ?Estimated Blood Loss:  Estimated blood loss: none. ?     [facility] ?

## 2021-07-08 NOTE — Anesthesia Procedure Notes (Signed)
Date/Time: 07/08/2021 10:09 AM ?Performed by: Nelda Marseille, CRNA ?Pre-anesthesia Checklist: Patient identified, Emergency Drugs available, Suction available, Patient being monitored and Timeout performed ?Oxygen Delivery Method: Nasal cannula ? ? ? ? ?

## 2021-07-11 ENCOUNTER — Encounter: Payer: Self-pay | Admitting: Gastroenterology

## 2021-07-11 LAB — SURGICAL PATHOLOGY

## 2021-07-12 ENCOUNTER — Encounter: Payer: Self-pay | Admitting: Gastroenterology

## 2024-02-24 ENCOUNTER — Ambulatory Visit
Admission: EM | Admit: 2024-02-24 | Discharge: 2024-02-24 | Disposition: A | Discharge status: Home / Self Care | Attending: Emergency Medicine | Admitting: Emergency Medicine

## 2024-02-24 DIAGNOSIS — J0100 Acute maxillary sinusitis, unspecified: Secondary | ICD-10-CM | POA: Diagnosis not present

## 2024-02-24 MED ORDER — DOXYCYCLINE HYCLATE 100 MG PO CAPS: 100.0000 mg | ORAL_CAPSULE | Freq: Two times a day (BID) | ORAL | 0 refills | Status: AC

## 2024-02-24 NOTE — ED Triage Notes (Signed)
 Patient to Urgent Care with complaints of  green drainage/ nasal congestion/ headaches/ possible fever last night.   Symptoms x3 weeks.   Meds: mucinex sinus

## 2024-02-24 NOTE — ED Provider Notes (Signed)
 [facility]    CSN: [id number] Arrival date & time: 02/24/24  1045      History   Chief Complaint Chief Complaint  Patient presents with   Nasal Congestion   Headache   Fever    HPI Harold Sosa is a 53 y.o. male.  Patient presents with 3-week history of sinus pressure, congestion, sinus headache.  He reports subjective fever last night; felt warm and sweating.  He has been treating his symptoms with Tylenol , ibuprofen, Mucinex.  No cough, shortness of breath, vomiting, diarrhea.  The history is provided by the patient and medical records.    Past Medical History:  Diagnosis Date   Kidney stone on right side     Patient Active Problem List   Diagnosis Date Noted   Kidney stone 08/12/2014    Past Surgical History:  Procedure Laterality Date   COLONOSCOPY WITH PROPOFOL  N/A 07/08/2021   Procedure: COLONOSCOPY WITH PROPOFOL ;  Surgeon: Therisa Bi, MD;  Location: [facility] ENDOSCOPY;  Service: Gastroenterology;  Laterality: N/A;   CYSTOSCOPY W/ URETERAL STENT PLACEMENT Right 07-16-2014   CYSTOSCOPY W/ URETERAL STENT PLACEMENT Right 08/12/2014   Procedure: CYSTOSCOPY WITH STENT EXCHANGE ;  Surgeon: Rosina Riis, MD;  Location: [facility] ORS;  Service: Urology;  Laterality: Right;   HERNIA REPAIR     left inguinal hernia   URETEROSCOPY WITH HOLMIUM LASER LITHOTRIPSY Right 08/12/2014   Procedure: RIGHT URETEROSCOPY WITH BASKET EXTRACTION OF STONE ;  Surgeon: Rosina Riis, MD;  Location: [facility] ORS;  Service: Urology;  Laterality: Right;       Home Medications    Prior to Admission medications   Medication Sig Start Date End Date Taking? Authorizing Provider  doxycycline (VIBRAMYCIN) 100 MG capsule Take 1 capsule (100 mg total) by mouth 2 (two) times daily. 02/24/24  Yes Corlis Burnard DEL, NP  pantoprazole (PROTONIX) 40 MG tablet Take 40 mg by mouth. 01/30/16  Yes [provider]  rOPINIRole (REQUIP) 2 MG tablet Take 2 mg by mouth at bedtime. 12/11/23  Yes [provider]  traZODone (DESYREL) 50 MG tablet Take 50 mg by mouth. 12/11/23  Yes [provider]  atorvastatin (LIPITOR) 10 MG tablet Take 10 mg by mouth daily. Patient not taking: Reported on 02/24/2024    [provider]  cholecalciferol (VITAMIN D) 1000 UNITS tablet Take 5,000 Units by mouth daily. Patient not taking: Reported on 02/24/2024    [provider]  docusate sodium  (COLACE) 100 MG capsule Take 1 capsule (100 mg total) by mouth 2 (two) times daily as needed (take to keep stool soft.). Patient not taking: Reported on 02/24/2024 08/12/14   Riis Rosina, MD  omeprazole (PRILOSEC OTC) 20 MG tablet Take 20 mg by mouth 3 (three) times a week. Patient not taking: Reported on 02/24/2024    [provider]  oxyCODONE  (ROXICODONE ) 5 MG immediate release tablet Take 1 tablet (5 mg total) by mouth every 4 (four) hours as needed. Patient not taking: Reported on 02/24/2024 08/12/14   Riis Rosina, MD  oxyCODONE -acetaminophen  (ROXICET) 5-325 MG tablet Take 1 tablet by mouth every 6 (six) hours as needed for moderate pain or severe pain. Patient not taking: Reported on 02/24/2024 02/12/16   Menshew, Candida LULLA Kings, PA-C  predniSONE  (DELTASONE ) 10 MG tablet Take 1 tablet (10 mg total) by mouth 2 (two) times daily with a meal. Patient not taking: Reported on 07/08/2021 02/12/16   Menshew, Candida LULLA Kings, PA-C  Sod Picosulfate-Mag Ox-Cit Acd (CLENPIQ ) 10-3.5-12 MG-GM -  GM/160ML SOLN Take 1 kit by mouth as directed. At 5 PM evening before procedure, drink 1 bottle of Clenpiq , hydrate, drink (5) 8 oz of water. Then do the same thing 5 hours prior to your procedure. Patient not taking: Reported on 02/24/2024 06/02/21   Anna, Kiran, MD  tamsulosin (FLOMAX) 0.4 MG CAPS capsule Take 0.4 mg by mouth daily. Patient not taking: Reported on 07/08/2021    [provider]    Family History History reviewed. No pertinent family history.  Social History Social History    Tobacco Use   Smoking status: Every Day    Types: E-cigarettes   Smokeless tobacco: Never  Vaping Use   Vaping status: Every Day  Substance Use Topics   Alcohol use: No    Comment: once/month   Drug use: No     Allergies   Penicillins   Review of Systems Review of Systems  Constitutional:  Negative for chills and fever.  HENT:  Positive for congestion, postnasal drip, rhinorrhea, sinus pressure and sinus pain. Negative for ear pain and sore throat.   Respiratory:  Negative for cough and shortness of breath.   Neurological:  Positive for headaches.     Physical Exam Triage Vital Signs ED Triage Vitals  Encounter Vitals Group     BP 02/24/24 1106 112/72     Girls Systolic BP Percentile --      Girls Diastolic BP Percentile --      Boys Systolic BP Percentile --      Boys Diastolic BP Percentile --      Pulse Rate 02/24/24 1106 98     Resp 02/24/24 1106 18     Temp 02/24/24 1106 98.3 F (36.8 C)     Temp src --      SpO2 02/24/24 1106 96 %     Weight --      Height --      Head Circumference --      Peak Flow --      Pain Score 02/24/24 1112 4     Pain Loc --      Pain Education --      Exclude from Growth Chart --    No data found.  Updated Vital Signs BP 112/72   Pulse 98   Temp 98.3 F (36.8 C)   Resp 18   SpO2 96%   Visual Acuity Right Eye Distance:   Left Eye Distance:   Bilateral Distance:    Right Eye Near:   Left Eye Near:    Bilateral Near:     Physical Exam Constitutional:      General: He is not in acute distress. HENT:     Right Ear: Tympanic membrane normal.     Left Ear: Tympanic membrane normal.     Nose: Congestion present.     Mouth/Throat:     Mouth: Mucous membranes are moist.     Pharynx: Oropharynx is clear.  Cardiovascular:     Rate and Rhythm: Normal rate and regular rhythm.     Heart sounds: Normal heart sounds.  Pulmonary:     Effort: Pulmonary effort is normal. No respiratory distress.     Breath sounds:  Normal breath sounds.  Neurological:     Mental Status: He is alert.      UC Treatments / Results  Labs (all labs ordered are listed, but only abnormal results are displayed) Labs Reviewed - No data to display  EKG   Radiology No results  found.  Procedures Procedures (including critical care time)  Medications Ordered in UC Medications - No data to display  Initial Impression / Assessment and Plan / UC Course  I have reviewed the triage vital signs and the nursing notes.  Pertinent labs & imaging results that were available during my care of the patient were reviewed by me and considered in my medical decision making (see chart for details).    Acute sinusitis.  Afebrile and vital signs are stable.  Patient is allergic to penicillin.  Treating today with doxycycline.  Tylenol  or ibuprofen as needed.  Plain Mucinex as needed.  Instructed patient to follow-up with his PCP if he is not improving.  He agrees to plan of care.  Final Clinical Impressions(s) / UC Diagnoses   Final diagnoses:  Acute non-recurrent maxillary sinusitis     Discharge Instructions      Take the doxycycline as directed.  Follow-up with your primary care provider if your symptoms are not improving.      ED Prescriptions     Medication Sig Dispense Auth. Provider   doxycycline (VIBRAMYCIN) 100 MG capsule Take 1 capsule (100 mg total) by mouth 2 (two) times daily. 20 capsule Corlis Burnard DEL, NP      PDMP not reviewed this encounter.   Corlis Burnard DEL, NP 02/24/24 1130

## 2024-02-24 NOTE — Discharge Instructions (Addendum)
Take the doxycycline as directed.    Follow up with your primary care provider if your symptoms are not improving.
